# Patient Record
Sex: Female | Born: 1940 | Race: White | Hispanic: Yes | Smoking: Never smoker
Health system: Southern US, Community
[De-identification: ages and names within clinical notes are randomized; demographics above are authoritative.]

## PROBLEM LIST (undated history)

## (undated) DIAGNOSIS — F0150 Vascular dementia without behavioral disturbance: Secondary | ICD-10-CM

## (undated) DIAGNOSIS — F329 Major depressive disorder, single episode, unspecified: Secondary | ICD-10-CM

## (undated) DIAGNOSIS — F32A Depression, unspecified: Secondary | ICD-10-CM

## (undated) DIAGNOSIS — H269 Unspecified cataract: Secondary | ICD-10-CM

## (undated) DIAGNOSIS — R4702 Dysphasia: Secondary | ICD-10-CM

## (undated) DIAGNOSIS — R6251 Failure to thrive (child): Secondary | ICD-10-CM

## (undated) DIAGNOSIS — E119 Type 2 diabetes mellitus without complications: Secondary | ICD-10-CM

## (undated) DIAGNOSIS — I10 Essential (primary) hypertension: Secondary | ICD-10-CM

## (undated) DIAGNOSIS — R630 Anorexia: Secondary | ICD-10-CM

## (undated) DIAGNOSIS — F419 Anxiety disorder, unspecified: Secondary | ICD-10-CM

## (undated) DIAGNOSIS — I2510 Atherosclerotic heart disease of native coronary artery without angina pectoris: Secondary | ICD-10-CM

## (undated) DIAGNOSIS — I639 Cerebral infarction, unspecified: Secondary | ICD-10-CM

## (undated) DIAGNOSIS — E785 Hyperlipidemia, unspecified: Secondary | ICD-10-CM

## (undated) DIAGNOSIS — E876 Hypokalemia: Secondary | ICD-10-CM

## (undated) HISTORY — PX: OTHER SURGICAL HISTORY: SHX169

---

## 2017-08-14 ENCOUNTER — Emergency Department (HOSPITAL_COMMUNITY): Payer: Medicaid Other

## 2017-08-14 ENCOUNTER — Other Ambulatory Visit: Payer: Self-pay

## 2017-08-14 ENCOUNTER — Inpatient Hospital Stay (HOSPITAL_COMMUNITY)
Admission: EM | Admit: 2017-08-14 | Discharge: 2017-08-22 | DRG: 070 | Disposition: A | Discharge status: Hospice | Payer: Medicaid Other | Attending: Internal Medicine | Admitting: Internal Medicine

## 2017-08-14 ENCOUNTER — Encounter (HOSPITAL_COMMUNITY): Payer: Self-pay | Admitting: *Deleted

## 2017-08-14 DIAGNOSIS — Z515 Encounter for palliative care: Secondary | ICD-10-CM | POA: Diagnosis not present

## 2017-08-14 DIAGNOSIS — E43 Unspecified severe protein-calorie malnutrition: Secondary | ICD-10-CM | POA: Diagnosis present

## 2017-08-14 DIAGNOSIS — Z7189 Other specified counseling: Secondary | ICD-10-CM | POA: Diagnosis not present

## 2017-08-14 DIAGNOSIS — E871 Hypo-osmolality and hyponatremia: Secondary | ICD-10-CM | POA: Diagnosis not present

## 2017-08-14 DIAGNOSIS — G9341 Metabolic encephalopathy: Secondary | ICD-10-CM | POA: Diagnosis present

## 2017-08-14 DIAGNOSIS — F0150 Vascular dementia without behavioral disturbance: Secondary | ICD-10-CM | POA: Diagnosis present

## 2017-08-14 DIAGNOSIS — R52 Pain, unspecified: Secondary | ICD-10-CM | POA: Diagnosis not present

## 2017-08-14 DIAGNOSIS — I639 Cerebral infarction, unspecified: Secondary | ICD-10-CM | POA: Diagnosis not present

## 2017-08-14 DIAGNOSIS — I69351 Hemiplegia and hemiparesis following cerebral infarction affecting right dominant side: Secondary | ICD-10-CM | POA: Diagnosis not present

## 2017-08-14 DIAGNOSIS — N179 Acute kidney failure, unspecified: Secondary | ICD-10-CM | POA: Diagnosis present

## 2017-08-14 DIAGNOSIS — E785 Hyperlipidemia, unspecified: Secondary | ICD-10-CM | POA: Diagnosis present

## 2017-08-14 DIAGNOSIS — E876 Hypokalemia: Secondary | ICD-10-CM | POA: Diagnosis not present

## 2017-08-14 DIAGNOSIS — Z66 Do not resuscitate: Secondary | ICD-10-CM | POA: Diagnosis not present

## 2017-08-14 DIAGNOSIS — G3183 Dementia with Lewy bodies: Secondary | ICD-10-CM | POA: Diagnosis present

## 2017-08-14 DIAGNOSIS — Z8049 Family history of malignant neoplasm of other genital organs: Secondary | ICD-10-CM

## 2017-08-14 DIAGNOSIS — E870 Hyperosmolality and hypernatremia: Secondary | ICD-10-CM | POA: Diagnosis present

## 2017-08-14 DIAGNOSIS — I10 Essential (primary) hypertension: Secondary | ICD-10-CM | POA: Diagnosis present

## 2017-08-14 DIAGNOSIS — E861 Hypovolemia: Secondary | ICD-10-CM | POA: Diagnosis present

## 2017-08-14 DIAGNOSIS — E119 Type 2 diabetes mellitus without complications: Secondary | ICD-10-CM | POA: Diagnosis present

## 2017-08-14 DIAGNOSIS — Z833 Family history of diabetes mellitus: Secondary | ICD-10-CM

## 2017-08-14 DIAGNOSIS — I2510 Atherosclerotic heart disease of native coronary artery without angina pectoris: Secondary | ICD-10-CM | POA: Diagnosis present

## 2017-08-14 DIAGNOSIS — E860 Dehydration: Secondary | ICD-10-CM | POA: Diagnosis present

## 2017-08-14 DIAGNOSIS — E875 Hyperkalemia: Secondary | ICD-10-CM | POA: Diagnosis not present

## 2017-08-14 DIAGNOSIS — G8929 Other chronic pain: Secondary | ICD-10-CM | POA: Diagnosis not present

## 2017-08-14 DIAGNOSIS — R627 Adult failure to thrive: Secondary | ICD-10-CM | POA: Diagnosis not present

## 2017-08-14 DIAGNOSIS — E872 Acidosis: Secondary | ICD-10-CM | POA: Diagnosis not present

## 2017-08-14 DIAGNOSIS — F0280 Dementia in other diseases classified elsewhere without behavioral disturbance: Secondary | ICD-10-CM | POA: Diagnosis present

## 2017-08-14 DIAGNOSIS — L8990 Pressure ulcer of unspecified site, unspecified stage: Secondary | ICD-10-CM | POA: Diagnosis present

## 2017-08-14 DIAGNOSIS — R4702 Dysphasia: Secondary | ICD-10-CM | POA: Diagnosis present

## 2017-08-14 DIAGNOSIS — N390 Urinary tract infection, site not specified: Secondary | ICD-10-CM | POA: Diagnosis present

## 2017-08-14 DIAGNOSIS — N3000 Acute cystitis without hematuria: Secondary | ICD-10-CM | POA: Diagnosis not present

## 2017-08-14 HISTORY — DX: Vascular dementia, unspecified severity, without behavioral disturbance, psychotic disturbance, mood disturbance, and anxiety: F01.50

## 2017-08-14 HISTORY — DX: Failure to thrive (child): R62.51

## 2017-08-14 HISTORY — DX: Cerebral infarction, unspecified: I63.9

## 2017-08-14 HISTORY — DX: Anxiety disorder, unspecified: F41.9

## 2017-08-14 HISTORY — DX: Anorexia: R63.0

## 2017-08-14 HISTORY — DX: Atherosclerotic heart disease of native coronary artery without angina pectoris: I25.10

## 2017-08-14 HISTORY — DX: Depression, unspecified: F32.A

## 2017-08-14 HISTORY — DX: Hypokalemia: E87.6

## 2017-08-14 HISTORY — DX: Major depressive disorder, single episode, unspecified: F32.9

## 2017-08-14 HISTORY — DX: Hyperlipidemia, unspecified: E78.5

## 2017-08-14 HISTORY — DX: Dysphasia: R47.02

## 2017-08-14 HISTORY — DX: Essential (primary) hypertension: I10

## 2017-08-14 HISTORY — DX: Unspecified cataract: H26.9

## 2017-08-14 HISTORY — DX: Type 2 diabetes mellitus without complications: E11.9

## 2017-08-14 LAB — CBC WITH DIFFERENTIAL/PLATELET
Basophils Absolute: 0 10*3/uL (ref 0.0–0.1)
Basophils Relative: 0 %
EOS ABS: 0 10*3/uL (ref 0.0–0.7)
EOS PCT: 0 %
HCT: 50.9 % — ABNORMAL HIGH (ref 36.0–46.0)
Hemoglobin: 15.5 g/dL — ABNORMAL HIGH (ref 12.0–15.0)
LYMPHS ABS: 2.7 10*3/uL (ref 0.7–4.0)
LYMPHS PCT: 26 %
MCH: 30.1 pg (ref 26.0–34.0)
MCHC: 30.5 g/dL (ref 30.0–36.0)
MCV: 98.8 fL (ref 78.0–100.0)
MONO ABS: 0.6 10*3/uL (ref 0.1–1.0)
Monocytes Relative: 5 %
Neutro Abs: 7.1 10*3/uL (ref 1.7–7.7)
Neutrophils Relative %: 69 %
PLATELETS: 209 10*3/uL (ref 150–400)
RBC: 5.15 MIL/uL — ABNORMAL HIGH (ref 3.87–5.11)
RDW: 16.4 % — AB (ref 11.5–15.5)
WBC: 10.4 10*3/uL (ref 4.0–10.5)

## 2017-08-14 LAB — BASIC METABOLIC PANEL
BUN: 132 mg/dL — AB (ref 6–20)
CALCIUM: 8.5 mg/dL — AB (ref 8.9–10.3)
CO2: 20 mmol/L — ABNORMAL LOW (ref 22–32)
CREATININE: 2.27 mg/dL — AB (ref 0.44–1.00)
Chloride: >130 mmol/L (ref 101–111)
GFR calc non Af Amer: 20 mL/min — ABNORMAL LOW (ref >60)
GFR, EST AFRICAN AMERICAN: 23 mL/min — AB (ref >60)
GLUCOSE: 123 mg/dL — AB (ref 65–99)
Potassium: 3.9 mmol/L (ref 3.5–5.1)
Sodium: 171 mmol/L (ref 135–145)

## 2017-08-14 LAB — COMPREHENSIVE METABOLIC PANEL
ALBUMIN: 3.5 g/dL (ref 3.5–5.0)
ALT: 22 U/L (ref 14–54)
AST: 18 U/L (ref 15–41)
Alkaline Phosphatase: 90 U/L (ref 38–126)
BILIRUBIN TOTAL: 0.8 mg/dL (ref 0.3–1.2)
BUN: 140 mg/dL — AB (ref 6–20)
CO2: 20 mmol/L — ABNORMAL LOW (ref 22–32)
CREATININE: 2.63 mg/dL — AB (ref 0.44–1.00)
Calcium: 9.3 mg/dL (ref 8.9–10.3)
GFR calc Af Amer: 19 mL/min — ABNORMAL LOW (ref >60)
GFR calc non Af Amer: 16 mL/min — ABNORMAL LOW (ref >60)
GLUCOSE: 133 mg/dL — AB (ref 65–99)
POTASSIUM: 4.3 mmol/L (ref 3.5–5.1)
Sodium: 172 mmol/L (ref 135–145)
Total Protein: 8 g/dL (ref 6.5–8.1)

## 2017-08-14 LAB — I-STAT CG4 LACTIC ACID, ED
LACTIC ACID, VENOUS: 1.8 mmol/L (ref 0.5–1.9)
Lactic Acid, Venous: 2.12 mmol/L (ref 0.5–1.9)

## 2017-08-14 LAB — URINALYSIS, ROUTINE W REFLEX MICROSCOPIC
Bilirubin Urine: NEGATIVE
Glucose, UA: NEGATIVE mg/dL
Hgb urine dipstick: NEGATIVE
KETONES UR: NEGATIVE mg/dL
Nitrite: NEGATIVE
PH: 5 (ref 5.0–8.0)
Protein, ur: NEGATIVE mg/dL
SPECIFIC GRAVITY, URINE: 1.02 (ref 1.005–1.030)

## 2017-08-14 LAB — I-STAT TROPONIN, ED: Troponin i, poc: 0.03 ng/mL (ref 0.00–0.08)

## 2017-08-14 LAB — CBG MONITORING, ED: GLUCOSE-CAPILLARY: 132 mg/dL — AB (ref 65–99)

## 2017-08-14 LAB — GLUCOSE, CAPILLARY: Glucose-Capillary: 107 mg/dL — ABNORMAL HIGH (ref 65–99)

## 2017-08-14 LAB — SODIUM: SODIUM: 172 mmol/L — AB (ref 135–145)

## 2017-08-14 LAB — OSMOLALITY: Osmolality: 397 mOsm/kg (ref 275–295)

## 2017-08-14 MED ORDER — VANCOMYCIN HCL 500 MG IV SOLR: 500.0000 mg | INTRAVENOUS | Status: DC

## 2017-08-14 MED ORDER — ONDANSETRON HCL 4 MG/2ML IJ SOLN: 4.0000 mg | Freq: Three times a day (TID) | INTRAMUSCULAR | Status: DC | PRN

## 2017-08-14 MED ORDER — SODIUM CHLORIDE 0.9 % IV SOLN
Freq: Once | INTRAVENOUS | Status: AC
  Administered 2017-08-14: 150 mL/h via INTRAVENOUS

## 2017-08-14 MED ORDER — VANCOMYCIN HCL IN DEXTROSE 1-5 GM/200ML-% IV SOLN
1000.0000 mg | Freq: Once | INTRAVENOUS | Status: AC
  Administered 2017-08-14: 1000 mg via INTRAVENOUS
  Filled 2017-08-14: qty 200

## 2017-08-14 MED ORDER — DEXTROSE 5 % IV SOLN
1.0000 g | INTRAVENOUS | Status: DC
  Administered 2017-08-14 – 2017-08-17 (×4): 1 g via INTRAVENOUS
  Filled 2017-08-14 (×4): qty 10

## 2017-08-14 MED ORDER — DEXTROSE 5 % IV SOLN
INTRAVENOUS | Status: DC
  Administered 2017-08-14: 23:00:00 via INTRAVENOUS

## 2017-08-14 MED ORDER — PIPERACILLIN-TAZOBACTAM IN DEX 2-0.25 GM/50ML IV SOLN
2.2500 g | Freq: Four times a day (QID) | INTRAVENOUS | Status: DC
  Filled 2017-08-14 (×2): qty 50

## 2017-08-14 MED ORDER — INSULIN ASPART 100 UNIT/ML ~~LOC~~ SOLN
0.0000 [IU] | Freq: Three times a day (TID) | SUBCUTANEOUS | Status: DC
  Administered 2017-08-15: 1 [IU] via SUBCUTANEOUS

## 2017-08-14 MED ORDER — PIPERACILLIN-TAZOBACTAM 3.375 G IVPB 30 MIN
3.3750 g | Freq: Once | INTRAVENOUS | Status: AC
  Administered 2017-08-14: 3.375 g via INTRAVENOUS
  Filled 2017-08-14: qty 50

## 2017-08-14 MED ORDER — DEXTROSE-NACL 5-0.45 % IV SOLN: INTRAVENOUS | Status: DC

## 2017-08-14 MED ORDER — SODIUM CHLORIDE 0.9 % IV BOLUS (SEPSIS)
1000.0000 mL | Freq: Once | INTRAVENOUS | Status: AC
  Administered 2017-08-14: 1000 mL via INTRAVENOUS

## 2017-08-14 MED ORDER — ASPIRIN 300 MG RE SUPP
150.0000 mg | Freq: Every day | RECTAL | Status: DC
  Administered 2017-08-15 – 2017-08-19 (×6): 150 mg via RECTAL
  Filled 2017-08-14 (×7): qty 1

## 2017-08-14 MED ORDER — INSULIN GLARGINE 100 UNIT/ML ~~LOC~~ SOLN
7.0000 [IU] | Freq: Two times a day (BID) | SUBCUTANEOUS | Status: DC
  Administered 2017-08-14 – 2017-08-15 (×2): 7 [IU] via SUBCUTANEOUS
  Filled 2017-08-14 (×4): qty 0.07

## 2017-08-14 MED ORDER — ACETAMINOPHEN 650 MG RE SUPP
650.0000 mg | Freq: Four times a day (QID) | RECTAL | Status: DC | PRN
  Filled 2017-08-14: qty 1

## 2017-08-14 MED ORDER — HYDRALAZINE HCL 20 MG/ML IJ SOLN
5.0000 mg | INTRAMUSCULAR | Status: DC | PRN
Start: 2017-08-14 — End: 2017-08-19

## 2017-08-14 MED ORDER — ENOXAPARIN SODIUM 30 MG/0.3ML ~~LOC~~ SOLN
30.0000 mg | SUBCUTANEOUS | Status: DC
  Administered 2017-08-14 – 2017-08-18 (×5): 30 mg via SUBCUTANEOUS
  Filled 2017-08-14 (×5): qty 0.3

## 2017-08-14 NOTE — ED Triage Notes (Addendum)
[facility].  Per norm pt is non-verbal and can only track with her eyes.  Pt will not follow commands of EMS.  Hx of stroke with R sided paralysis.  Presently L sided facial droop.  cbg 126, 118/86, hr 120, rr 24, 93 RA (98% on 3L).  18 LAC.  LSN "some time in the middle of the night".

## 2017-08-14 NOTE — ED Notes (Signed)
MD notified of elevated labs. 

## 2017-08-14 NOTE — H&P (Addendum)
History and Physical    Pamela Parks WUJ:[id number] DOB: 10-19-1940 DOA: 08/14/2017  Referring MD/NP/PA:   PCP: Justin Mend, MD   Patient coming from:  The patient is coming from SNF.  At baseline, pt is dependent for most of ADL.  Chief Complaint: AMS  HPI: Pamela Parks is a 77 y.o. female with medical history significant of DM, CAD, HLD, HTN, prior CVA with right sided weakness, vascular dementia, non-verbal at baseline, who presents with AMS.  Patient has dementia, non-verbal at baseline and AMS. She is unable to provide any medical history. I spoke to her daughter on the phone. She told me that she dose not know exactly what happened to her. She states that she was called from SNF and was told that her mother is more confused. Most of the history is obtained by discussing the case with ED physician, per EMS report, and with the nursing staff.  Per report, the patient is a resident at [facility] and [address]  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 According to EMS they were told she is nonverbal but can track people around the room with her eyes at baseline. She was noted to be more confused than her baseline. She was last seen at her normal baseline at some point in the night. When I saw pt in ED, she is nonverbal, does not follow commands, not oriented 3. No active respiratory distress, cough, nausea, vomiting, diarrhea noted. She moves her left arm and slightly moves left leg. She has right-sided paralysis. Not sure if she has any pain any where.  ED Course: pt was found to have WBC 10.4, positive urinalysis with moderate amount of leukocyte, lactic acid of 2.12, 1.86, sodium 172, creatinine 2.63, BUN 140, tachycardia,tachypnea, oxygen saturation 94% on room air, chest x-ray negative, troponin negative. Patient is admitted to telemetry bed as inpatient.  CT-head showed: 1. Chronic left MCA and watershed distribution infarcts. No acute  intracranial abnormality. Atrophy with chronic small vessel ischemia. 2. Unusual hyperdensities, slightly serpiginous appearance within the left globe as above. Recommend correlation with patient's prior ophthalmologic procedures and exam. Given maintained spherical appearance of the globe, pthisis bulbi is believed less likely. Varix or stigmata of prior ocular intervention are some considerations.  Review of Systems: Could not be reviewed due to dementia, nonverbal status and altered mental status  Allergy: No Known Allergies  Past Medical History:  Diagnosis Date  . Anorexia   . Anxiety   . Cataract   . Coronary artery disease   . Depression   . Diabetes mellitus without complication (HCC)   . Dysphasia   . Failure to thrive (0-17)   . Hyperlipidemia   . Hypertension   . Hypokalemia   . Stroke [facility])    R sided paralysis  . Vascular dementia     Past Surgical History:  Procedure Laterality Date  . left arm     bone fracture    Social History:  reports that  has never smoked. She does not have any smokeless tobacco history on file. She reports that she does not drink alcohol or use drugs.   Family History:  Family History  Problem Relation Age of Onset  . Uterine cancer Mother   . Diabetes Mellitus II Father     Prior to Admission medications   Not on File    Physical Exam: Vitals:   08/14/17 1700 08/14/17 1715 08/14/17 1730 08/14/17 1930  BP: 121/78 122/75 113/77 108/72  Pulse: 99 97 (!) 101 88  Resp: 15 13 13 13   Temp:      TempSrc:      SpO2: 96% 95% 96% 95%   General: Not in acute distress. Dry mucus and membrane, cachectic looking HEENT:       Eyes: right eye with PERRL, EOMI, no scleral icterus. Has cataract in left eye       ENT: No discharge from the ears and nose.       Neck: No JVD, no bruit, no mass felt. Heme: No neck lymph node enlargement. Cardiac: S1/S2, RRR, No murmurs, No gallops or rubs. Respiratory: No rales, wheezing, rhonchi or  rubs. GI: Soft, nondistended, nontender, no organomegaly, BS present. GU: No hematuria Ext: No pitting leg edema bilaterally. 2+DP/PT pulse bilaterally. Musculoskeletal: No joint deformities. Skin: No rashes.  Neuro: AMS, not following commands, not oriented X3, She moves her left arm and slightly moves left leg. She has right-sided paralysis. Psych: Patient is not psychotic.  Labs on Admission: I have personally reviewed following labs and imaging studies  CBC: Recent Labs  Lab 08/14/17 1603  WBC 10.4  NEUTROABS 7.1  HGB 15.5*  HCT 50.9*  MCV 98.8  PLT 209   Basic Metabolic Panel: Recent Labs  Lab 08/14/17 1603 08/14/17 1809  NA 172* 172*  K 4.3  --   CL >130*  --   CO2 20*  --   GLUCOSE 133*  --   BUN 140*  --   CREATININE 2.63*  --   CALCIUM 9.3  --    GFR: CrCl cannot be calculated (Unknown ideal weight.). Liver Function Tests: Recent Labs  Lab 08/14/17 1603  AST 18  ALT 22  ALKPHOS 90  BILITOT 0.8  PROT 8.0  ALBUMIN 3.5   No results for input(s): LIPASE, AMYLASE in the last 168 hours. No results for input(s): AMMONIA in the last 168 hours. Coagulation Profile: No results for input(s): INR, PROTIME in the last 168 hours. Cardiac Enzymes: No results for input(s): CKTOTAL, CKMB, CKMBINDEX, TROPONINI in the last 168 hours. BNP (last 3 results) No results for input(s): PROBNP in the last 8760 hours. HbA1C: No results for input(s): HGBA1C in the last 72 hours. CBG: Recent Labs  Lab 08/14/17 1542  GLUCAP 132*   Lipid Profile: No results for input(s): CHOL, HDL, LDLCALC, TRIG, CHOLHDL, LDLDIRECT in the last 72 hours. Thyroid Function Tests: No results for input(s): TSH, T4TOTAL, FREET4, T3FREE, THYROIDAB in the last 72 hours. Anemia Panel: No results for input(s): VITAMINB12, FOLATE, FERRITIN, TIBC, IRON, RETICCTPCT in the last 72 hours. Urine analysis:    Component Value Date/Time   COLORURINE AMBER (A) 08/14/2017 1727   APPEARANCEUR CLOUDY (A)  08/14/2017 1727   LABSPEC 1.020 08/14/2017 1727   PHURINE 5.0 08/14/2017 1727   GLUCOSEU NEGATIVE 08/14/2017 1727   HGBUR NEGATIVE 08/14/2017 1727   BILIRUBINUR NEGATIVE 08/14/2017 1727   KETONESUR NEGATIVE 08/14/2017 1727   PROTEINUR NEGATIVE 08/14/2017 1727   NITRITE NEGATIVE 08/14/2017 1727   LEUKOCYTESUR MODERATE (A) 08/14/2017 1727   Sepsis Labs: @LABRCNTIP (procalcitonin:4,lacticidven:4) )No results found for this or any previous visit (from the past 240 hour(s)).   Radiological Exams on Admission: Dg Chest 2 View  Result Date: 08/14/2017 CLINICAL DATA:  Tachycardia and altered mental status. EXAM: CHEST  2 VIEW COMPARISON:  None. FINDINGS: Heart size is top-normal. Moderate aortic atherosclerosis at the arch without aneurysm. Lungs are clear with minimal scarring or atelectasis in the left lower lobe. Left shoulder arthroplasty  without complications. Mild degenerate change along the dorsal spine. IMPRESSION: No active cardiopulmonary disease.  Aortic atherosclerosis. Electronically Signed   By: Tollie Ethavid  Kwon M.D.   On: 08/14/2017 17:14   Ct Head Wo Contrast  Result Date: 08/14/2017 CLINICAL DATA:  Altered level of consciousness unexplained. History of right-sided paralysis with left-sided facial droop. Patient is nonverbal. EXAM: CT HEAD WITHOUT CONTRAST TECHNIQUE: Contiguous axial images were obtained from the base of the skull through the vertex without intravenous contrast. COMPARISON:  None. FINDINGS: Brain: Frontoparietal left-sided encephalomalacia with volume loss compatible with remote infarcts. Superficial and central atrophy is noted with chronic appearing small vessel ischemic disease. No acute intracranial hemorrhage, midline shift or edema. No intra-axial mass nor extra-axial fluid collections. Vascular: Moderate atherosclerotic calcifications of the cavernous sinus carotids. No hyperdense vessels. Skull: Negative for fracture or focal lesions. Sinuses/Orbits: Mild sphenoid  sinus mucosal thickening. Intact globes with unusual serpiginous hyperdensities in the left globe possibly representing a varix. Recommend correlation with patient's prior ophthalmologic procedures and exam. Other: None IMPRESSION: 1. Chronic left MCA and watershed distribution infarcts. No acute intracranial abnormality. Atrophy with chronic small vessel ischemia. 2. Unusual hyperdensities, slightly serpiginous appearance within the left globe as above. Recommend correlation with patient's prior ophthalmologic procedures and exam. Given maintained spherical appearance of the globe, pthisis bulbi is believed less likely. Varix or stigmata of prior ocular intervention are some considerations. Electronically Signed   By: Tollie Ethavid  Kwon M.D.   On: 08/14/2017 17:03     EKG: Independently reviewed.  Sinus rhythm, QTC 498, LAD, low voltage, poor R-wave progression   Assessment/Plan Principal Problem:   Acute metabolic encephalopathy Active Problems:   Stroke (HCC)   Hypertension   Diabetes mellitus without complication (HCC)   HLD (hyperlipidemia)   Dehydration   Hypernatremia   AKI (acute kidney injury) (HCC)   UTI (urinary tract infection)   Protein-calorie malnutrition, severe (HCC)   Acute metabolic encephalopathy: Likely due to metastatic to factor etiology, including UTI, hyponatremia, dehydration, worsening renal function, in the setting of dementia. CT head did not show acute new intracranial abnormalities.  -will admit to tele bed as inpt -treat UTI, hydration and correct hyperatremia -Frequent neuro checks -Hold oral medications  UTI: -IV rocephin -f/u Bx and Ux  Hypernatremia and dehydration: Na 172.  This is likely chronic (>48 hours).  -IVF: received 1L NS bolus in ED, will continue D5 at 100 cc/h -check BMP q4h -check urine and plasma Osmo and urine sodium level  Addendum: the BMP showed Na decreased from 172 to 166 in about 8 hours, too fast. -will d/c D5  -start NS at  rate of 75 cc/h -f/u BMP q4h  Hx of Stroke [facility](HCC): -ASA per rectum   Protein-calorie malnutrition, severe (HCC) -Consult to Nutrition  HTN: -IV prn hydralazine  Diabetes mellitus without complication (HCC): Last A1c not on record. Patient is taking lantus at home -will decrease Lantus dose from 14 to 7 U bid -SSI -Check A1c  AKI: Cre 2.63 and BUN 140. No baseline Cre available. ikely due to prerenal secondary to dehydration - IVF as above - Check FeNa  - Follow up renal function by BMP  HLD (hyperlipidemia): not taking meds. -check FLP  Abnormal findings by CT-head: CT-head showed "unusual hyperdensities, slightly serpiginous appearance within the left globe as above. Recommend correlation with patient's prior ophthalmologic procedures and exam. Given maintained spherical appearance of the globe, pthisis bulbi is believed less likely. Varix or stigmata of prior ocular intervention are  some considerations". -May need to have further clarification with family in the morning   DVT ppx: SQ Lovenox Code Status: Full code ( I discussed with her daughter on the phone) Family Communication: None at bed side.   Disposition Plan:  Anticipate discharge back to previous SNF Consults called:  none Admission status:  Inpatient/tele      Date of Service 08/14/2017    Lorretta Harp [facility] Pager [phone]  If 7PM-7AM, please contact night-coverage [email or web address] Password [email or web address] 08/14/2017, 8:20 PM

## 2017-08-14 NOTE — ED Notes (Signed)
PT to ct

## 2017-08-14 NOTE — Progress Notes (Signed)
Pharmacy Antibiotic Note  Pamela MartiniMaria Parks is a 77 y.o. female admitted on 08/14/2017 with sepsis.  Pharmacy has been consulted for vancomycin and Zosyn dosing. Elevated Scr at 2.63 (Normalized CrCl 20.4 ml/min). WBC and lactate WNL. Ke =0.021 Vd=36L T1/2 ~ 33 hours  Plan: Vancomycin 1000mg  IV x1 Vancomycin 500mg  IV q48h. Goal Trough 15-20 mcg/mL.  Zosyn 3.375gm IV x1 (30 min infusion) Zosyn 2.25gm IV q6h F/u renal fxn, trough @ SS, clinical resolution  No data recorded.  Recent Labs  Lab 08/14/17 1603 08/14/17 1608 08/14/17 1829  WBC 10.4  --   --   CREATININE 2.63*  --   --   LATICACIDVEN  --  2.12* 1.80    CrCl cannot be calculated (Unknown ideal weight.).    Allergies no known allergies  Antimicrobials this admission: Vanc 1/23 >>  Zosyn 1/23 >>   Dose adjustments this admission: None  Microbiology results: Pending  Thank you for allowing pharmacy to be a part of this patient's care.  Robie Mcniel 08/14/2017 6:35 PM

## 2017-08-14 NOTE — ED Provider Notes (Signed)
Emergency Department Provider Note   I have reviewed the triage vital signs and the nursing notes.   HISTORY  HPI Pamela Parks is a 77 y.o. female with PMH of DM, CAD, HLD, HTN, prior CVA with right sided weakness, and vascular dementia presents to the emergency department by EMS with concern for altered mental status.  [facility] [address] [facility]  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  EMS state that patient is more altered compared to her baseline.  According to EMS they were told she is nonverbal but can track people around the room with her eyes.  Staff report that she was last seen normal at some point in the night.  No other supporting information from EMS.   Level 5 caveat: Dementia and acute AMS.    Past Medical History:  Diagnosis Date  . Anorexia   . Anxiety   . Cataract   . Coronary artery disease   . Depression   . Diabetes mellitus without complication (HCC)   . Dysphasia   . Failure to thrive (0-17)   . Hyperlipidemia   . Hypertension   . Hypokalemia   . Stroke [facility](HCC)    R sided paralysis  . Vascular dementia     Patient Active Problem List   Diagnosis Date Noted  . HLD (hyperlipidemia) 08/14/2017  . Acute metabolic encephalopathy 08/14/2017  . Dehydration 08/14/2017  . Hypernatremia 08/14/2017  . AKI (acute kidney injury) (HCC) 08/14/2017  . UTI (urinary tract infection) 08/14/2017  . Protein-calorie malnutrition, severe (HCC) 08/14/2017  . Stroke (HCC)   . Hypertension   . Diabetes mellitus without complication [place](HCC)     Past Surgical History:  Procedure Laterality Date  . left arm     bone fracture      Allergies Patient has no known allergies.  Family History  Problem Relation Age of Onset  . Uterine cancer Mother   . Diabetes Mellitus II Father     Social History Social History   Tobacco Use  . Smoking status: Never Smoker  Substance Use Topics  . Alcohol use: No     Frequency: Never  . Drug use: No    Review of Systems  Level 5 caveat: AMS and Dementia.   ____________________________________________   PHYSICAL EXAM:  VITAL SIGNS: Vitals:   08/14/17 2030 08/14/17 2126  BP: 108/72 129/66  Pulse: 85 86  Resp: 13 16  Temp:  97.6 F (36.4 C)  SpO2: 95% 97%    Constitutional: Non-verbal but looking around the room. Not responding to painful stimulus.  Eyes: Conjunctivae are normal. Cloudy cornea on the left. Sluggish pupil on the right.  Head: Atraumatic. Nose: No congestion/rhinnorhea. Mouth/Throat: Mucous membranes are dry. Neck: No stridor.  Cardiovascular: Sinus tachycardia. Good peripheral circulation. Grossly normal heart sounds.   Respiratory: Normal respiratory effort.  No retractions. Lungs CTAB. Gastrointestinal: Soft and nontender. No distention.  Musculoskeletal: Contracted RUE and RLE.  Neurologic: Possible left face droop. Contracted, spastic paralysis of the RUE and RLE.  Skin:  Skin is warm, dry and intact. No rash noted.  ____________________________________________   LABS (all labs ordered are listed, but only abnormal results are displayed)  Labs Reviewed  COMPREHENSIVE METABOLIC PANEL - Abnormal; Notable for the following components:      Result Value   Sodium 172 (*)    Chloride >130 (*)    CO2 20 (*)    Glucose, Bld 133 (*)  BUN 140 (*)    Creatinine, Ser 2.63 (*)    GFR calc non Af Amer 16 (*)    GFR calc Af Amer 19 (*)    All other components within normal limits  CBC WITH DIFFERENTIAL/PLATELET - Abnormal; Notable for the following components:   RBC 5.15 (*)    Hemoglobin 15.5 (*)    HCT 50.9 (*)    RDW 16.4 (*)    All other components within normal limits  URINALYSIS, ROUTINE W REFLEX MICROSCOPIC - Abnormal; Notable for the following components:   Color, Urine AMBER (*)    APPearance CLOUDY (*)    Leukocytes, UA MODERATE (*)    Bacteria, UA MANY (*)    Squamous Epithelial / LPF 0-5 (*)     All other components within normal limits  SODIUM - Abnormal; Notable for the following components:   Sodium 172 (*)    All other components within normal limits  BASIC METABOLIC PANEL - Abnormal; Notable for the following components:   Sodium 171 (*)    Chloride >130 (*)    CO2 20 (*)    Glucose, Bld 123 (*)    BUN 132 (*)    Creatinine, Ser 2.27 (*)    Calcium 8.5 (*)    GFR calc non Af Amer 20 (*)    GFR calc Af Amer 23 (*)    All other components within normal limits  OSMOLALITY - Abnormal; Notable for the following components:   Osmolality 397 (*)    All other components within normal limits  GLUCOSE, CAPILLARY - Abnormal; Notable for the following components:   Glucose-Capillary 107 (*)    All other components within normal limits  CBG MONITORING, ED - Abnormal; Notable for the following components:   Glucose-Capillary 132 (*)    All other components within normal limits  I-STAT CG4 LACTIC ACID, ED - Abnormal; Notable for the following components:   Lactic Acid, Venous 2.12 (*)    All other components within normal limits  CULTURE, BLOOD (ROUTINE X 2)  CULTURE, BLOOD (ROUTINE X 2)  URINE CULTURE  BASIC METABOLIC PANEL  BASIC METABOLIC PANEL  OSMOLALITY, URINE  CREATININE, URINE, RANDOM  SODIUM, URINE, RANDOM  HEMOGLOBIN A1C  LIPID PANEL  I-STAT TROPONIN, ED  I-STAT CG4 LACTIC ACID, ED   ____________________________________________  EKG   EKG Interpretation  Date/Time:  Wednesday August 14 2017 15:36:26 EST Ventricular Rate:  121 PR Interval:    QRS Duration: 82 QT Interval:  351 QTC Calculation: 498 R Axis:   -116 Text Interpretation:  Sinus tachycardia LAD, consider LAFB or inferior infarct Consider anterior infarct No STEMI.  Confirmed by Alona Bene [phone]) on 08/14/2017 3:49:14 PM Also confirmed by Alona Bene [phone]), editor Barbette Hair [phone])  on 08/14/2017 4:29:56 PM       ____________________________________________  RADIOLOGY  Dg Chest  2 View  Result Date: 08/14/2017 CLINICAL DATA:  Tachycardia and altered mental status. EXAM: CHEST  2 VIEW COMPARISON:  None. FINDINGS: Heart size is top-normal. Moderate aortic atherosclerosis at the arch without aneurysm. Lungs are clear with minimal scarring or atelectasis in the left lower lobe. Left shoulder arthroplasty without complications. Mild degenerate change along the dorsal spine. IMPRESSION: No active cardiopulmonary disease.  Aortic atherosclerosis. Electronically Signed   By: Tollie Eth M.D.   On: 08/14/2017 17:14   Ct Head Wo Contrast  Result Date: 08/14/2017 CLINICAL DATA:  Altered level of consciousness unexplained. History of right-sided paralysis with left-sided facial droop. Patient  is nonverbal. EXAM: CT HEAD WITHOUT CONTRAST TECHNIQUE: Contiguous axial images were obtained from the base of the skull through the vertex without intravenous contrast. COMPARISON:  None. FINDINGS: Brain: Frontoparietal left-sided encephalomalacia with volume loss compatible with remote infarcts. Superficial and central atrophy is noted with chronic appearing small vessel ischemic disease. No acute intracranial hemorrhage, midline shift or edema. No intra-axial mass nor extra-axial fluid collections. Vascular: Moderate atherosclerotic calcifications of the cavernous sinus carotids. No hyperdense vessels. Skull: Negative for fracture or focal lesions. Sinuses/Orbits: Mild sphenoid sinus mucosal thickening. Intact globes with unusual serpiginous hyperdensities in the left globe possibly representing a varix. Recommend correlation with patient's prior ophthalmologic procedures and exam. Other: None IMPRESSION: 1. Chronic left MCA and watershed distribution infarcts. No acute intracranial abnormality. Atrophy with chronic small vessel ischemia. 2. Unusual hyperdensities, slightly serpiginous appearance within the left globe as above. Recommend correlation with patient's prior ophthalmologic procedures and exam.  Given maintained spherical appearance of the globe, pthisis bulbi is believed less likely. Varix or stigmata of prior ocular intervention are some considerations. Electronically Signed   By: Tollie Eth M.D.   On: 08/14/2017 17:03    ____________________________________________   PROCEDURES  Procedure(s) performed:   .Critical Care Performed by: Maia Plan, MD Authorized by: Maia Plan, MD   Critical care provider statement:    Critical care time (minutes):  50   Critical care time was exclusive of:  Separately billable procedures and treating other patients and teaching time   Critical care was necessary to treat or prevent imminent or life-threatening deterioration of the following conditions:  Metabolic crisis   Critical care was time spent personally by me on the following activities:  Blood draw for specimens, development of treatment plan with patient or surrogate, discussions with consultants, evaluation of patient's response to treatment, examination of patient, ordering and performing treatments and interventions, ordering and review of laboratory studies, ordering and review of radiographic studies, pulse oximetry, re-evaluation of patient's condition and review of old charts   I assumed direction of critical care for this patient from another provider in my specialty: no    ____________________________________________   INITIAL IMPRESSION / ASSESSMENT AND PLAN / ED COURSE  Pertinent labs & imaging results that were available during my care of the patient were reviewed by me and considered in my medical decision making (see chart for details).  Patient presents to the emergency department for evaluation of altered mental status last seen normal last night.  Staff questions some left face droop which may be new.  Patient has known right-sided deficits from prior stroke.  She is tachycardic here but afebrile.  Lactic acid elevated to 2.12.  Initiated broad-spectrum  antibiotics in case this is from infectious etiology.  Obtaining CT of the head along with chest x-ray and full sepsis workup at this time.   05:37 PM Patient with sodium of 172. Patient appears very dry and hypovolemic. Got 1L bolus and starting on IVF at rate of 150 ml/hr. Ordered Q2H sodium. Waiting on UA and will discuss with admit team. No famiyl at bedside for additional history.   Discussed patient's case with Hospitalist, Dr. Clyde Lundborg to request admission. Patient and family (if present) updated with plan. Care transferred to Hospitalist service.  I reviewed all nursing notes, vitals, pertinent old records, EKGs, labs, imaging (as available).  ____________________________________________  FINAL CLINICAL IMPRESSION(S) / ED DIAGNOSES  Final diagnoses:  Cerebrovascular accident (CVA), unspecified mechanism (HCC)  Essential hypertension  Diabetes mellitus without  complication (HCC)     MEDICATIONS GIVEN DURING THIS VISIT:  Medications  cefTRIAXone (ROCEPHIN) 1 g in dextrose 5 % 50 mL IVPB (1 g Intravenous New Bag/Given 08/14/17 2255)  hydrALAZINE (APRESOLINE) injection 5 mg (not administered)  ondansetron (ZOFRAN) injection 4 mg (not administered)  acetaminophen (TYLENOL) suppository 650 mg (not administered)  aspirin suppository 150 mg (not administered)  insulin glargine (LANTUS) injection 7 Units (not administered)  insulin aspart (novoLOG) injection 0-9 Units (not administered)  enoxaparin (LOVENOX) injection 30 mg (30 mg Subcutaneous Given 08/14/17 2257)  dextrose 5 % solution ( Intravenous New Bag/Given 08/14/17 2253)  sodium chloride 0.9 % bolus 1,000 mL (0 mLs Intravenous Stopped 08/14/17 1737)  piperacillin-tazobactam (ZOSYN) IVPB 3.375 g (0 g Intravenous Stopped 08/14/17 1706)  vancomycin (VANCOCIN) IVPB 1000 mg/200 mL premix (0 mg Intravenous Stopped 08/14/17 1837)  0.9 %  sodium chloride infusion (150 mL/hr Intravenous New Bag/Given 08/14/17 1738)    Note:  This document was  prepared using Dragon voice recognition software and may include unintentional dictation errors.  Alona Bene, MD Emergency Medicine    Phil Corti, Arlyss Repress, MD 08/14/17 [phone]

## 2017-08-15 ENCOUNTER — Encounter (HOSPITAL_COMMUNITY): Payer: Self-pay | Admitting: *Deleted

## 2017-08-15 ENCOUNTER — Other Ambulatory Visit: Payer: Self-pay

## 2017-08-15 ENCOUNTER — Inpatient Hospital Stay (HOSPITAL_COMMUNITY): Payer: Medicaid Other

## 2017-08-15 DIAGNOSIS — E43 Unspecified severe protein-calorie malnutrition: Secondary | ICD-10-CM

## 2017-08-15 LAB — LIPID PANEL
CHOL/HDL RATIO: 6.4 ratio
CHOLESTEROL: 147 mg/dL (ref 0–200)
HDL: 23 mg/dL — AB (ref >40)
LDL Cholesterol: 86 mg/dL (ref 0–99)
TRIGLYCERIDES: 192 mg/dL — AB (ref <150)
VLDL: 38 mg/dL (ref 0–40)

## 2017-08-15 LAB — BASIC METABOLIC PANEL
BUN: 125 mg/dL — ABNORMAL HIGH (ref 6–20)
BUN: 103 mg/dL — AB (ref 6–20)
BUN: 106 mg/dL — AB (ref 6–20)
BUN: 87 mg/dL — ABNORMAL HIGH (ref 6–20)
CALCIUM: 7.9 mg/dL — AB (ref 8.9–10.3)
CALCIUM: 7.7 mg/dL — AB (ref 8.9–10.3)
CO2: 18 mmol/L — ABNORMAL LOW (ref 22–32)
CO2: 19 mmol/L — ABNORMAL LOW (ref 22–32)
CO2: 18 mmol/L — AB (ref 22–32)
CO2: 19 mmol/L — ABNORMAL LOW (ref 22–32)
CREATININE: 1.89 mg/dL — AB (ref 0.44–1.00)
Calcium: 7.8 mg/dL — ABNORMAL LOW (ref 8.9–10.3)
Calcium: 7.8 mg/dL — ABNORMAL LOW (ref 8.9–10.3)
Chloride: >130 mmol/L (ref 101–111)
Chloride: >130 mmol/L (ref 101–111)
Creatinine, Ser: 2.04 mg/dL — ABNORMAL HIGH (ref 0.44–1.00)
Creatinine, Ser: 1.87 mg/dL — ABNORMAL HIGH (ref 0.44–1.00)
Creatinine, Ser: 1.61 mg/dL — ABNORMAL HIGH (ref 0.44–1.00)
GFR calc Af Amer: 29 mL/min — ABNORMAL LOW (ref >60)
GFR calc Af Amer: 28 mL/min — ABNORMAL LOW (ref >60)
GFR calc non Af Amer: 25 mL/min — ABNORMAL LOW (ref >60)
GFR calc non Af Amer: 25 mL/min — ABNORMAL LOW (ref >60)
GFR calc non Af Amer: 30 mL/min — ABNORMAL LOW (ref >60)
GFR, EST AFRICAN AMERICAN: 26 mL/min — AB (ref >60)
GFR, EST AFRICAN AMERICAN: 34 mL/min — AB (ref >60)
GFR, EST NON AFRICAN AMERICAN: 22 mL/min — AB (ref >60)
GLUCOSE: 140 mg/dL — AB (ref 65–99)
Glucose, Bld: 120 mg/dL — ABNORMAL HIGH (ref 65–99)
Glucose, Bld: 61 mg/dL — ABNORMAL LOW (ref 65–99)
Glucose, Bld: 182 mg/dL — ABNORMAL HIGH (ref 65–99)
POTASSIUM: 4 mmol/L (ref 3.5–5.1)
POTASSIUM: 3 mmol/L — AB (ref 3.5–5.1)
Potassium: 3.5 mmol/L (ref 3.5–5.1)
Potassium: 3.4 mmol/L — ABNORMAL LOW (ref 3.5–5.1)
SODIUM: 164 mmol/L — AB (ref 135–145)
Sodium: 166 mmol/L (ref 135–145)
Sodium: 163 mmol/L (ref 135–145)
Sodium: 163 mmol/L (ref 135–145)

## 2017-08-15 LAB — GLUCOSE, CAPILLARY
GLUCOSE-CAPILLARY: 127 mg/dL — AB (ref 65–99)
GLUCOSE-CAPILLARY: 40 mg/dL — AB (ref 65–99)
GLUCOSE-CAPILLARY: 194 mg/dL — AB (ref 65–99)
GLUCOSE-CAPILLARY: 134 mg/dL — AB (ref 65–99)
Glucose-Capillary: 115 mg/dL — ABNORMAL HIGH (ref 65–99)
Glucose-Capillary: 174 mg/dL — ABNORMAL HIGH (ref 65–99)
Glucose-Capillary: 61 mg/dL — ABNORMAL LOW (ref 65–99)

## 2017-08-15 LAB — HEMOGLOBIN A1C
HEMOGLOBIN A1C: 8.4 % — AB (ref 4.8–5.6)
Mean Plasma Glucose: 194.38 mg/dL

## 2017-08-15 LAB — SODIUM, URINE, RANDOM: SODIUM UR: 24 mmol/L

## 2017-08-15 LAB — MRSA PCR SCREENING: MRSA by PCR: NEGATIVE

## 2017-08-15 LAB — OSMOLALITY, URINE: Osmolality, Ur: 538 mOsm/kg (ref 300–900)

## 2017-08-15 LAB — CREATININE, URINE, RANDOM: CREATININE, URINE: 200.83 mg/dL

## 2017-08-15 MED ORDER — DEXTROSE 50 % IV SOLN
INTRAVENOUS | Status: AC
  Administered 2017-08-15: 50 mL
  Filled 2017-08-15: qty 50

## 2017-08-15 MED ORDER — DEXTROSE-NACL 5-0.45 % IV SOLN
INTRAVENOUS | Status: DC
  Administered 2017-08-15 – 2017-08-16 (×2): via INTRAVENOUS

## 2017-08-15 MED ORDER — SODIUM CHLORIDE 0.9 % IV SOLN
INTRAVENOUS | Status: DC
  Administered 2017-08-15: 07:00:00 via INTRAVENOUS

## 2017-08-15 MED ORDER — POTASSIUM CHLORIDE 10 MEQ/100ML IV SOLN
10.0000 meq | INTRAVENOUS | Status: AC
  Administered 2017-08-15 (×4): 10 meq via INTRAVENOUS
  Filled 2017-08-15 (×4): qty 100

## 2017-08-15 MED ORDER — INSULIN ASPART 100 UNIT/ML ~~LOC~~ SOLN
0.0000 [IU] | SUBCUTANEOUS | Status: DC
  Administered 2017-08-16: 2 [IU] via SUBCUTANEOUS
  Administered 2017-08-16: 1 [IU] via SUBCUTANEOUS
  Administered 2017-08-17 (×2): 3 [IU] via SUBCUTANEOUS
  Administered 2017-08-17: 2 [IU] via SUBCUTANEOUS
  Administered 2017-08-17: 3 [IU] via SUBCUTANEOUS
  Administered 2017-08-18: 5 [IU] via SUBCUTANEOUS
  Administered 2017-08-18: 1 [IU] via SUBCUTANEOUS
  Administered 2017-08-18: 3 [IU] via SUBCUTANEOUS
  Administered 2017-08-19: 2 [IU] via SUBCUTANEOUS
  Administered 2017-08-19 (×2): 1 [IU] via SUBCUTANEOUS

## 2017-08-15 NOTE — Progress Notes (Signed)
Patient CBG was 40 at lunch time.  Hypoglycemia protocol followed.  CBG up to 174 after D50 IV push.  MD notified.  Elnita MaxwellSalome N Alyssia Heese, RN

## 2017-08-15 NOTE — Progress Notes (Signed)
Initial Nutrition Assessment  DOCUMENTATION CODES:   Severe malnutrition in context of chronic illness  INTERVENTION:   -RD will follow for diet advancement and supplement as appropriate -If pt desires aggressive care and unable to safely take po's, recommend:  Initiate Jevity 1.2 @ 10 ml/hr and increase by 10 ml every 12 hours to goal rate of 40 ml/hr.   Tube feeding regimen provides 1152 kcal (100% of needs), 53 grams of protein, and 775 ml of H2O.   NUTRITION DIAGNOSIS:   Severe Malnutrition related to chronic illness(dementia) as evidenced by energy intake < 75% for > or equal to 1 month, severe fat depletion, severe muscle depletion.  GOAL:   Patient will meet greater than or equal to 90% of their needs  MONITOR:   Diet advancement, Labs, Weight trends, Skin, I & O's  REASON FOR ASSESSMENT:   Consult, Malnutrition Screening Tool, Low Braden Assessment of nutrition requirement/status  ASSESSMENT:   Pamela Parks is a 77 y.o. female with medical history significant of DM, CAD, HLD, HTN, prior CVA with right sided weakness, vascular dementia, non-verbal at baseline, who presents with AMS.  Pt admitted with acute metabolic encephalopathy.   Reviewed records from [facility]; pertinent medications PTA include Mighty Shake PRN, sliding scale insulin lispro 4 times daily, insulin glargine 7 units BID, remeron, and marinol).   Spoke with pt granddaughter at bedside; pt with multiple family members who are very involved in her care. Granddaughter states pt has experienced a general decline in health over the past 2 years, after being sent to rehab facility in [place]. Pt has continued to decline over the past several months since being transferred to sister facility in [place] as a result of [place]. Per granddaughter, pt has had an even more significant decline over the past month. Pt has been a very poor and selective eater, however, pt has been consuming  bites and sips, despite encouragement and being fed by family members. She was not on a modified diet PTA, byt granddaughter has noticed both decline in lack of desire to eat and difficulty swallowing foods and liquids. Per granddaughter, pt has lost weight, particularly in the past month, however unsure of height, UBW, or amount of wt lost.   Palliative care consulted to discuss goals of care.   Labs reviewed: Na: 163, CBGS: 40-174(inaptient orders for glycemic control are 0-9 units insulin aspart TID with meals and 7 units insulin glargine BID).   NUTRITION - FOCUSED PHYSICAL EXAM:    Most Recent Value  Orbital Region  Mild depletion  Upper Arm Region  Severe depletion  Thoracic and Lumbar Region  Severe depletion  Buccal Region  Mild depletion  Temple Region  Mild depletion  Clavicle Bone Region  Moderate depletion  Clavicle and Acromion Bone Region  Moderate depletion  Scapular Bone Region  Moderate depletion  Dorsal Hand  Severe depletion  Patellar Region  Severe depletion  Anterior Thigh Region  Severe depletion  Posterior Calf Region  Severe depletion  Edema (RD Assessment)  None  Hair  Reviewed  Eyes  Reviewed  Mouth  Reviewed  Skin  Reviewed  Nails  Reviewed       Diet Order:  Diet NPO time specified  EDUCATION NEEDS:   Not appropriate for education at this time  Skin:  Skin Assessment: Reviewed RN Assessment  Last BM:  08/15/17  Height:   Ht Readings from Last 1 Encounters:  No data found for Ht    Weight:  Wt Readings from Last 1 Encounters:  08/15/17 83 lb (37.6 kg)    Ideal Body Weight:     BMI:  There is no height or weight on file to calculate BMI.  Estimated Nutritional Needs:   Kcal:  1100-1300  Protein:  55-70 grams  Fluid:  1.1-1.3 L    Lamontae Ricardo A. Mayford KnifeWilliams, RD, LDN, CDE Pager: [phone] After hours Pager: [phone]

## 2017-08-15 NOTE — Progress Notes (Signed)
PROGRESS NOTE    Pamela MartiniMaria Parks  ZOX:[id number] DOB: [birth year] DOA: 08/14/2017 PCP: Justin MendArnaez Zapata, Gerardo E, MD   Brief Narrative: Pamela MartiniMaria Parks is a 77 y.o. 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 She presented with altered mental status with severe hypernatremia and acute kidney injury.   Assessment & Plan:   Principal Problem:   Acute metabolic encephalopathy Active Problems:   Stroke (HCC)   Hypertension   Diabetes mellitus without complication (HCC)   HLD (hyperlipidemia)   Dehydration   Hypernatremia   AKI (acute kidney injury) (HCC)   UTI (urinary tract infection)   Protein-calorie malnutrition, severe (HCC)   Acute metabolic encephalopathy Patient already has poor baseline. Currently with severe hypernatremia and elevated BUN. Possibly combination of uremia and hypernatremia. -management below  Hypernatremia Patient's sodium of 172. Down to 163. -switch to D5 1/2 NS at 50 ml/hr -Continue BMP q 4 hours until steady decrease achieved  Acute kidney injury Unknown baseline. Improving with IV fluids. BUN improved as well. Minimal urine output. -Bladder scan -Renal ultrasound  UTI Urine culture pending -Continue ceftriaxone  History of stroke -Continue aspirin  Vascular dementia Patient is minimally interactive at baseline. She is not verbal and sometimes follows commands at baseline. Not currently at baseline.  Severe protein-calorie malnutrition -Dietitian consulted  Essential hypertension -Continue IV hydralazine prn  Diabetes mellitus, insulin dependent -Continue Lantus and SSI -Switch to D5 1/2 NS @ 50 ml/hr  Hyperlipidemia Not on medications.  Abnormal CT head findings Unknown if patient has had history of surgery per discussion with family. At this time, no urgent workup. Pending goals of care, can follow-up outpatient.  Goals of  care Patient is appropriate for hospice. Family is under the idea that trying is better than nothing. Discussed patient's apparent course from my assessment. Baseline is already poor. Patient would likely require tube feeding as she is not taking oral nutrition adequately; pros and cons discussed. Will get palliative care to assist with continued goals of care discussion.   DVT prophylaxis: Lovenox Code Status: Full code Family Communication: Daughter and granddaughter at bedside Disposition Plan: Pending medical improvement   Consultants:   Palliative care medicine  Procedures:   None  Antimicrobials:  Vancomycin (1/23)  Zosyn (1/23)  Ceftriaxone (1/23>>   Subjective: Afebrile overnight.  Objective: Vitals:   08/15/17 0428 08/15/17 0638 08/15/17 0758 08/15/17 1140  BP: (!) 96/50  122/68 (!) 113/55  Pulse: 84  83 87  Resp: 16  16 16   Temp: (!) 97.4 F (36.3 C) (!) 97.3 F (36.3 C) (!) 97.3 F (36.3 C) (!) 97.4 F (36.3 C)  TempSrc: Oral Axillary Axillary Axillary  SpO2: 100%  100% 99%  Weight: 37.6 kg (83 lb)       Intake/Output Summary (Last 24 hours) at 08/15/2017 1145 Last data filed at 08/15/2017 0530 Gross per 24 hour  Intake 1423.33 ml  Output 450 ml  Net 973.33 ml   Filed Weights   08/14/17 2126 08/15/17 0428  Weight: 38.1 kg (83 lb 14.4 oz) 37.6 kg (83 lb)    Examination:  General exam: Appears calm and comfortable Respiratory system: Clear to auscultation. Respiratory effort normal. Cardiovascular system: S1 & S2 heard, RRR. No murmurs. Gastrointestinal system: Abdomen is nondistended, soft and nontender. Normal bowel sounds heard. Central nervous system: Somnolent but arouses to noxious stimuli.  Extremities: No edema. No calf tenderness Skin: No cyanosis. No rashes Psychiatry:  Judgement and insight impaired    Data Reviewed: I have personally reviewed following labs and imaging studies  CBC: Recent Labs  Lab 08/14/17 1603  WBC 10.4   NEUTROABS 7.1  HGB 15.5*  HCT 50.9*  MCV 98.8  PLT 209   Basic Metabolic Panel: Recent Labs  Lab 08/14/17 1603 08/14/17 1809 08/14/17 2137 08/15/17 0240 08/15/17 0824  NA 172* 172* 171* 166* 164*  K 4.3  --  3.9 3.5 4.0  CL >130*  --  >130* >130* >130*  CO2 20*  --  20* 18* 19*  GLUCOSE 133*  --  123* 140* 120*  BUN 140*  --  132* 125* 103*  CREATININE 2.63*  --  2.27* 2.04* 1.87*  CALCIUM 9.3  --  8.5* 7.9* 7.8*   GFR: CrCl cannot be calculated (Unknown ideal weight.). Liver Function Tests: Recent Labs  Lab 08/14/17 1603  AST 18  ALT 22  ALKPHOS 90  BILITOT 0.8  PROT 8.0  ALBUMIN 3.5   No results for input(s): LIPASE, AMYLASE in the last 168 hours. No results for input(s): AMMONIA in the last 168 hours. Coagulation Profile: No results for input(s): INR, PROTIME in the last 168 hours. Cardiac Enzymes: No results for input(s): CKTOTAL, CKMB, CKMBINDEX, TROPONINI in the last 168 hours. BNP (last 3 results) No results for input(s): PROBNP in the last 8760 hours. HbA1C: Recent Labs    08/15/17 0240  HGBA1C 8.4*   CBG: Recent Labs  Lab 08/14/17 1542 08/14/17 2133 08/15/17 0801 08/15/17 1135  GLUCAP 132* 107* 127* 40*   Lipid Profile: Recent Labs    08/15/17 0240  CHOL 147  HDL 23*  LDLCALC 86  TRIG 960*  CHOLHDL 6.4   Thyroid Function Tests: No results for input(s): TSH, T4TOTAL, FREET4, T3FREE, THYROIDAB in the last 72 hours. Anemia Panel: No results for input(s): VITAMINB12, FOLATE, FERRITIN, TIBC, IRON, RETICCTPCT in the last 72 hours. Sepsis Labs: Recent Labs  Lab 08/14/17 1608 08/14/17 1829  LATICACIDVEN 2.12* 1.80    Recent Results (from the past 240 hour(s))  Blood Culture (routine x 2)     Status: None (Preliminary result)   Collection Time: 08/14/17  4:25 PM  Result Value Ref Range Status   Specimen Description BLOOD RIGHT HAND  Final   Special Requests   Final    BOTTLES DRAWN AEROBIC AND ANAEROBIC Blood Culture adequate  volume   Culture NO GROWTH < 24 HOURS  Final   Report Status PENDING  Incomplete  Blood Culture (routine x 2)     Status: None (Preliminary result)   Collection Time: 08/14/17  4:58 PM  Result Value Ref Range Status   Specimen Description BLOOD RIGHT FOREARM  Final   Special Requests   Final    BOTTLES DRAWN AEROBIC AND ANAEROBIC Blood Culture adequate volume   Culture NO GROWTH < 24 HOURS  Final   Report Status PENDING  Incomplete  MRSA PCR Screening     Status: None   Collection Time: 08/15/17  3:21 AM  Result Value Ref Range Status   MRSA by PCR NEGATIVE NEGATIVE Final    Comment:        The GeneXpert MRSA Assay (FDA approved for NASAL specimens only), is one component of a comprehensive MRSA colonization surveillance program. It is not intended to diagnose MRSA infection nor to guide or monitor treatment for MRSA infections.          Radiology Studies: Dg Chest 2 View  Result Date: 08/14/2017  CLINICAL DATA:  Tachycardia and altered mental status. EXAM: CHEST  2 VIEW COMPARISON:  None. FINDINGS: Heart size is top-normal. Moderate aortic atherosclerosis at the arch without aneurysm. Lungs are clear with minimal scarring or atelectasis in the left lower lobe. Left shoulder arthroplasty without complications. Mild degenerate change along the dorsal spine. IMPRESSION: No active cardiopulmonary disease.  Aortic atherosclerosis. Electronically Signed   By: Tollie Eth M.D.   On: 08/14/2017 17:14   Ct Head Wo Contrast  Result Date: 08/14/2017 CLINICAL DATA:  Altered level of consciousness unexplained. History of right-sided paralysis with left-sided facial droop. Patient is nonverbal. EXAM: CT HEAD WITHOUT CONTRAST TECHNIQUE: Contiguous axial images were obtained from the base of the skull through the vertex without intravenous contrast. COMPARISON:  None. FINDINGS: Brain: Frontoparietal left-sided encephalomalacia with volume loss compatible with remote infarcts. Superficial and  central atrophy is noted with chronic appearing small vessel ischemic disease. No acute intracranial hemorrhage, midline shift or edema. No intra-axial mass nor extra-axial fluid collections. Vascular: Moderate atherosclerotic calcifications of the cavernous sinus carotids. No hyperdense vessels. Skull: Negative for fracture or focal lesions. Sinuses/Orbits: Mild sphenoid sinus mucosal thickening. Intact globes with unusual serpiginous hyperdensities in the left globe possibly representing a varix. Recommend correlation with patient's prior ophthalmologic procedures and exam. Other: None IMPRESSION: 1. Chronic left MCA and watershed distribution infarcts. No acute intracranial abnormality. Atrophy with chronic small vessel ischemia. 2. Unusual hyperdensities, slightly serpiginous appearance within the left globe as above. Recommend correlation with patient's prior ophthalmologic procedures and exam. Given maintained spherical appearance of the globe, pthisis bulbi is believed less likely. Varix or stigmata of prior ocular intervention are some considerations. Electronically Signed   By: Tollie Eth M.D.   On: 08/14/2017 17:03        Scheduled Meds: . aspirin  150 mg Rectal Daily  . enoxaparin (LOVENOX) injection  30 mg Subcutaneous Q24H  . insulin aspart  0-9 Units Subcutaneous Q4H  . insulin glargine  7 Units Subcutaneous BID   Continuous Infusions: . sodium chloride 75 mL/hr at 08/15/17 0705  . cefTRIAXone (ROCEPHIN)  IV Stopped (08/14/17 2325)     LOS: 1 day     Jacquelin Hawking, MD [facility] 08/15/2017, 11:45 AM Pager: [phone]  If 7PM-7AM, please contact night-coverage [email or web address] Password [facility] 08/15/2017, 11:45 AM

## 2017-08-15 NOTE — Plan of Care (Signed)
  Skin Integrity: Risk for impaired skin integrity will decrease 08/15/2017 0157 - Completed/Met by Evert Kohl, RN   Safety: Ability to remain free from injury will improve 08/15/2017 0157 - Completed/Met by Garnett-Mellinger, Sophronia Simas, RN

## 2017-08-15 NOTE — Progress Notes (Signed)
Pt's family can meet with pallative care 3pm tommorow, they would also like a interpreter present to help with communication, pt and family speaks [place], thanks MIke F Charity fundraiserN.

## 2017-08-15 NOTE — Accreditation Note (Signed)
Patient remains non-verbal throuh  Out the night. Critical lads ( slightly improving) texted  to Dr Clyde LundborgNiu and Wellsite geologistchorr. Patient remains the same only following one with eye movement. No further deviation noted after spoke with Dr Clyde Lundborgniu last night.

## 2017-08-15 NOTE — Significant Event (Addendum)
Rapid Response Event Note  Overview: Call Time 2052 Arrival Time 2100  Initial Focused Assessment: Neurologic Called by RN for patient being "unreponsive", per RN this is not new and patient has been like this since admission.  Charge RN was able to awaken patient with sternal rub and patient does not speak [place] (spanish speaking). When I arrived, patient was awake, nonverbal ( per daughter and grand[facility] and has dementia). Patient was able to follow commands on the left and able to move the left and has right sided weakness and left facial droop from old CVA.   VSS.  Labs indicate hypernatremia (Na of 163) and Cl greater than 130.  RN had call primary service as well. Blood sugar was checked, it was 134. Left eye has had some opthalmic work done but Right eye is normal, reactive, tracks and follows. AKI is improving and sodium and chloride have been elevated, not a new finding.   Of note patient is being treated for a UTI (rocephin) and is getting a renal ultrasound tonight. PMT is meeting with the family tomorrow for GOC.   Interventions: -- Bladder scan patient after she returns from ultrasound, patient has needed I/O caths done -- Follow mental status, CT head from earlier did show chronic left MCA and watershed distribution infarcts and  atrophy with chronic small vessel Ischemia. -- Avoid sedation if possible. -- Currently patient is protecting her airway.   Plan of Care (if not transferred): -- Call RRT if needed  Event Summary:   at   2125   Pamela Parks R

## 2017-08-15 NOTE — Progress Notes (Signed)
Patient had in and out cath done due to low urine output.  430ml clear yellow urine obtained. Patient kept comfortable, no signs of distress. Pamela MaxwellSalome N Dorisann Schwanke, RN

## 2017-08-16 DIAGNOSIS — N3000 Acute cystitis without hematuria: Secondary | ICD-10-CM

## 2017-08-16 DIAGNOSIS — N179 Acute kidney failure, unspecified: Secondary | ICD-10-CM

## 2017-08-16 DIAGNOSIS — Z515 Encounter for palliative care: Secondary | ICD-10-CM

## 2017-08-16 DIAGNOSIS — E870 Hyperosmolality and hypernatremia: Secondary | ICD-10-CM

## 2017-08-16 DIAGNOSIS — E860 Dehydration: Secondary | ICD-10-CM

## 2017-08-16 DIAGNOSIS — G9341 Metabolic encephalopathy: Principal | ICD-10-CM

## 2017-08-16 LAB — BASIC METABOLIC PANEL
Anion gap: 12 (ref 5–15)
BUN: 42 mg/dL — AB (ref 6–20)
BUN: 48 mg/dL — AB (ref 6–20)
BUN: 58 mg/dL — ABNORMAL HIGH (ref 6–20)
BUN: 66 mg/dL — ABNORMAL HIGH (ref 6–20)
BUN: 74 mg/dL — ABNORMAL HIGH (ref 6–20)
BUN: 83 mg/dL — ABNORMAL HIGH (ref 6–20)
CO2: 15 mmol/L — ABNORMAL LOW (ref 22–32)
CO2: 16 mmol/L — ABNORMAL LOW (ref 22–32)
CO2: 17 mmol/L — ABNORMAL LOW (ref 22–32)
CO2: 18 mmol/L — ABNORMAL LOW (ref 22–32)
CO2: 19 mmol/L — ABNORMAL LOW (ref 22–32)
CO2: 20 mmol/L — ABNORMAL LOW (ref 22–32)
CREATININE: 0.94 mg/dL (ref 0.44–1.00)
Calcium: 7.8 mg/dL — ABNORMAL LOW (ref 8.9–10.3)
Calcium: 7.9 mg/dL — ABNORMAL LOW (ref 8.9–10.3)
Calcium: 7.9 mg/dL — ABNORMAL LOW (ref 8.9–10.3)
Calcium: 7.9 mg/dL — ABNORMAL LOW (ref 8.9–10.3)
Calcium: 7.9 mg/dL — ABNORMAL LOW (ref 8.9–10.3)
Calcium: 8 mg/dL — ABNORMAL LOW (ref 8.9–10.3)
Chloride: >130 mmol/L (ref 101–111)
Chloride: >130 mmol/L (ref 101–111)
Chloride: 127 mmol/L — ABNORMAL HIGH (ref 101–111)
Creatinine, Ser: 1.44 mg/dL — ABNORMAL HIGH (ref 0.44–1.00)
Creatinine, Ser: 1.38 mg/dL — ABNORMAL HIGH (ref 0.44–1.00)
Creatinine, Ser: 1.32 mg/dL — ABNORMAL HIGH (ref 0.44–1.00)
Creatinine, Ser: 1.1 mg/dL — ABNORMAL HIGH (ref 0.44–1.00)
Creatinine, Ser: 1.03 mg/dL — ABNORMAL HIGH (ref 0.44–1.00)
GFR calc Af Amer: 39 mL/min — ABNORMAL LOW (ref >60)
GFR calc Af Amer: 42 mL/min — ABNORMAL LOW (ref >60)
GFR calc Af Amer: 44 mL/min — ABNORMAL LOW (ref >60)
GFR calc non Af Amer: 36 mL/min — ABNORMAL LOW (ref >60)
GFR, EST AFRICAN AMERICAN: 55 mL/min — AB (ref >60)
GFR, EST AFRICAN AMERICAN: 59 mL/min — AB (ref >60)
GFR, EST NON AFRICAN AMERICAN: 34 mL/min — AB (ref >60)
GFR, EST NON AFRICAN AMERICAN: 38 mL/min — AB (ref >60)
GFR, EST NON AFRICAN AMERICAN: 47 mL/min — AB (ref >60)
GFR, EST NON AFRICAN AMERICAN: 51 mL/min — AB (ref >60)
GFR, EST NON AFRICAN AMERICAN: 57 mL/min — AB (ref >60)
GLUCOSE: 121 mg/dL — AB (ref 65–99)
Glucose, Bld: 141 mg/dL — ABNORMAL HIGH (ref 65–99)
Glucose, Bld: 143 mg/dL — ABNORMAL HIGH (ref 65–99)
Glucose, Bld: 165 mg/dL — ABNORMAL HIGH (ref 65–99)
Glucose, Bld: 117 mg/dL — ABNORMAL HIGH (ref 65–99)
Glucose, Bld: 124 mg/dL — ABNORMAL HIGH (ref 65–99)
POTASSIUM: 3.1 mmol/L — AB (ref 3.5–5.1)
POTASSIUM: 3.5 mmol/L (ref 3.5–5.1)
POTASSIUM: 3.9 mmol/L (ref 3.5–5.1)
POTASSIUM: 4.1 mmol/L (ref 3.5–5.1)
POTASSIUM: 4.2 mmol/L (ref 3.5–5.1)
Potassium: 3.3 mmol/L — ABNORMAL LOW (ref 3.5–5.1)
SODIUM: 156 mmol/L — AB (ref 135–145)
SODIUM: 159 mmol/L — AB (ref 135–145)
SODIUM: 159 mmol/L — AB (ref 135–145)
SODIUM: 160 mmol/L — AB (ref 135–145)
SODIUM: 162 mmol/L — AB (ref 135–145)
SODIUM: 163 mmol/L — AB (ref 135–145)

## 2017-08-16 LAB — GLUCOSE, CAPILLARY
GLUCOSE-CAPILLARY: 121 mg/dL — AB (ref 65–99)
GLUCOSE-CAPILLARY: 126 mg/dL — AB (ref 65–99)
Glucose-Capillary: 166 mg/dL — ABNORMAL HIGH (ref 65–99)
Glucose-Capillary: 119 mg/dL — ABNORMAL HIGH (ref 65–99)
Glucose-Capillary: 121 mg/dL — ABNORMAL HIGH (ref 65–99)

## 2017-08-16 LAB — URINE CULTURE
Culture: >=100000 — AB
Special Requests: NORMAL

## 2017-08-16 LAB — MAGNESIUM: Magnesium: 2.4 mg/dL (ref 1.7–2.4)

## 2017-08-16 LAB — PHOSPHORUS: Phosphorus: 1.9 mg/dL — ABNORMAL LOW (ref 2.5–4.6)

## 2017-08-16 MED ORDER — DEXTROSE 5 % IV SOLN
INTRAVENOUS | Status: DC
  Administered 2017-08-16: 55 mL via INTRAVENOUS
  Administered 2017-08-17 (×3): via INTRAVENOUS

## 2017-08-16 MED ORDER — POTASSIUM CHLORIDE 10 MEQ/100ML IV SOLN
10.0000 meq | INTRAVENOUS | Status: AC
  Administered 2017-08-16 (×4): 10 meq via INTRAVENOUS
  Filled 2017-08-16 (×4): qty 100

## 2017-08-16 MED ORDER — POTASSIUM PHOSPHATES 15 MMOLE/5ML IV SOLN
20.0000 meq | Freq: Once | INTRAVENOUS | Status: AC
  Administered 2017-08-17: 20 meq via INTRAVENOUS
  Filled 2017-08-16: qty 4.55

## 2017-08-16 NOTE — Progress Notes (Signed)
Na 163, Chloride greater than 130, K 3.4, MD notified, will continue to monitor, Thanks, Lavonda JumboMike F RN.

## 2017-08-16 NOTE — Progress Notes (Addendum)
Pt's family would prefer Saturday in am if available for pallative meeting instead of tommorow, thanks Glenna FellowsMike F RN.

## 2017-08-16 NOTE — Consult Note (Signed)
Consultation Note Date: 08/16/2017   Patient Name: Pamela Parks  DOB: [birth year]  MRN: [id number]  Age / Sex: 77 y.o., female  PCP: Earlyne Iba, MD Referring Physician: Mariel Aloe, MD  Reason for Consultation: Establishing goals of care  HPI/Patient Profile: 77 y.o. female with past medical history of vascular dementia, CVA with residual right sided weakness, DM, who is non-verbal at baseline.  She was admitted on 08/14/2017 with altered mental status from [facility].  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  Labs showed a sodium of 172 and she had acute renal failure with a creatinine of 2.6.  U/A was positive for infection.  Patient is being treated with antibiotics and IV fluids and her labs are improving significantly.  Clinical Assessment and Goals of Care:  I have reviewed medical records including EPIC notes, labs and imaging, received report from the care team, assessed the patient and then met at the bedside along with her daughter to discuss diagnosis prognosis, GOC, EOL wishes, disposition and options.  Her daughter speaks only spanish so an interpreter was used via an [place].  Even with the help of the interpreter the patient's daughter had difficulty understanding my questions.   I gathered from her that her mother had been suffering with dementia for at least 4 years.  The patient has 6 children (5 boys and 1 girl).  The children make decisions about their mother's care together.  There is not one solo Media planner.   The daughter asks me to wait for all of her siblings to arrive before discussing her mother's condition.  Four children live in [place] and two live in [place].  She suggested we talk tomorrow at 2:00 pm in her mothers room.  The daughter looks tearful multiple times during our conversation.   When I ask what's wrong she tells me people keep talking to her about resuscitation.  I advised her not to worry about resuscitation right now and that it would be discussed tomorrow in the meeting.  As far as functional and nutritional status - her daughter reports that her family used to take turns cooking and feeding her [place] - but now that she lives in the nursing home she is too far away and they are relying on the nursing staff.  Evidently her mother has stopped eating and drinking.  Per daughter her mother is Catholic and would appreciate a chaplain visit.   Primary Decision Maker:  NEXT OF KIN 6 children    SUMMARY OF RECOMMENDATIONS    PMT meeting scheduled with 6 children & an interpreter for 1/26 at 2:00 pm  Patient has advanced vascular dementia.  A PEG tube for feeding is not medically recommended in advanced dementia as it does not extend life or improve quality of life.     I have requested a Speech Therapy evaluation today as patient is more alert.   Code Status/Advance Care Planning:  Full code (per  notes family feels it would be better to try something rather than nothing)  Psycho-social/Spiritual:   Desire for further Chaplaincy support: yes requested  Prognosis:   Less than 2 weeks pending goals of care.  Patient appears to have the end stages of vascular dementia she is no longer independently eating/drinking, lifting her head or speaking.  She is hospice house eligible if her family chooses to go that route.  Discharge Planning: To Be Determined      Primary Diagnoses: Present on Admission: . Stroke ([place]) . Hypertension . HLD (hyperlipidemia) . Acute metabolic encephalopathy . Dehydration . Hypernatremia . AKI (acute kidney injury) ([place]) . UTI (urinary tract infection) . Protein-calorie malnutrition, severe ([place])   I have reviewed the medical record, interviewed the patient and family, and examined the patient. The following aspects are  pertinent.  Past Medical History:  Diagnosis Date  . Anorexia   . Anxiety   . Cataract   . Coronary artery disease   . Depression   . Diabetes mellitus without complication ([place])   . Dysphasia   . Failure to thrive (0-17)   . Hyperlipidemia   . Hypertension   . Hypokalemia   . Stroke [facility])    R sided paralysis  . Vascular dementia    Social History   Socioeconomic History  . Marital status: Widowed    Spouse name: None  . Number of children: None  . Years of education: None  . Highest education level: None  Social Needs  . Financial resource strain: None  . Food insecurity - worry: None  . Food insecurity - inability: None  . Transportation needs - medical: None  . Transportation needs - non-medical: None  Occupational History  . None  Tobacco Use  . Smoking status: Never Smoker  . Smokeless tobacco: Never Used  Substance and Sexual Activity  . Alcohol use: No    Frequency: Never  . Drug use: No  . Sexual activity: No  Other Topics Concern  . None  Social History Narrative  . None   Family History  Problem Relation Age of Onset  . Uterine cancer Mother   . Diabetes Mellitus II Father    Scheduled Meds: . aspirin  150 mg Rectal Daily  . enoxaparin (LOVENOX) injection  30 mg Subcutaneous Q24H  . insulin aspart  0-9 Units Subcutaneous Q4H   Continuous Infusions: . cefTRIAXone (ROCEPHIN)  IV Stopped (08/15/17 2357)  . dextrose 55 mL (08/16/17 1107)   PRN Meds:.acetaminophen, hydrALAZINE, ondansetron (ZOFRAN) IV No Known Allergies Review of Systems unable to obtain due to advanced dementia  Physical Exam  Frail elderly female, non verbal, awake, NAD, lying on her side.  CV very faint, no obvious m/r/g Resp no distress Abdomen soft, nt, nd  Vital Signs: BP 133/64 (BP Location: Left Arm)   Pulse 69   Temp 98.6 F (37 C) (Axillary)   Resp 18   Wt 40.1 kg (88 lb 6.5 oz)   SpO2 100%  Pain Assessment: No/denies pain     SpO2: SpO2: 100 % O2  Device:SpO2: 100 % O2 Flow Rate: .O2 Flow Rate (L/min): 2 L/min  IO: Intake/output summary:   Intake/Output Summary (Last 24 hours) at 08/16/2017 1203 Last data filed at 08/16/2017 0615 Gross per 24 hour  Intake 434.17 ml  Output 430 ml  Net 4.17 ml    LBM: Last BM Date: 08/15/17(small BM ) Baseline Weight: Weight: 38.1 kg (83 lb 14.4 oz) Most recent weight: Weight: 40.1 kg (  88 lb 6.5 oz)     Palliative Assessment/Data: 20%     Time In: 11:00 Time Out: 11:50 Time Total: 50 min. Greater than 50%  of this time was spent counseling and coordinating care related to the above assessment and plan.  Signed by: Florentina Jenny, PA-C Palliative Medicine Pager: [phone]  Please contact Palliative Medicine Team phone at [phone] for questions and concerns.  For individual provider: See Shea Evans

## 2017-08-16 NOTE — Progress Notes (Signed)
PHARMACIST - PHYSICIAN COMMUNICATION  CONCERNING:  Ceftriaxone   RECOMMENDATION: De-escalate  to ampicillin 1 gram iv Q 12   DESCRIPTION: Urine culture with E Coli sensitive to PCN  Thank you Okey RegalLisa Falon Flinchum, PharmD [phone]

## 2017-08-16 NOTE — Progress Notes (Signed)
K 3.9, this am, Na 162 now, and Chloride is still greater than 130, will notify MD, thanks Lavonda JumboMike F RN.

## 2017-08-16 NOTE — Progress Notes (Signed)
Patient Chloride continues to be elevated >130 and potassium 3.3. Notified X.Blount on call.  Will continue to monitor.  Jerrilynn Mikowski, RN

## 2017-08-16 NOTE — Progress Notes (Addendum)
PROGRESS NOTE    Monique Hefty  EAV:[id number] DOB: 03/11/1941 DOA: 08/14/2017 PCP: Justin Mend, MD   Brief Narrative: Raylea Adcox is a 77 y.o. 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 She presented with altered mental status with severe hypernatremia and acute kidney injury.   Assessment & Plan:   Principal Problem:   Acute metabolic encephalopathy Active Problems:   Stroke (HCC)   Hypertension   Diabetes mellitus without complication (HCC)   HLD (hyperlipidemia)   Dehydration   Hypernatremia   AKI (acute kidney injury) (HCC)   UTI (urinary tract infection)   Protein-calorie malnutrition, severe (HCC)   Acute metabolic encephalopathy Patient already has poor baseline. Currently with severe hypernatremia and elevated BUN. Possibly combination of uremia and hypernatremia. Improved mental status and likely at baseline. -management below  Hypernatremia Patient's sodium of 172 on admission. Down 3 mmol/L in 24 hours -switch to D5 water at 50 ml/hr -Continue BMP q 4 hours until steady decrease achieved  Acute kidney injury Unknown baseline. Improving with IV fluids. BUN improved as well. Minimal urine output. Renal ultrasound without hydronephrosis and there is some urine retention on bladder scan. -Strict in/out -in/out cath prn  Acute urinary retention Management above.  UTI Urine culture significant for pan-sensitive E. Coli -Transitioned to ampicillin per pharmacy protocol  History of stroke -Continue aspirin -SLP evaluation  Vascular dementia Patient is minimally interactive at baseline. She appears baseline today.   Severe protein-calorie malnutrition -Dietitian consulted  Essential hypertension -Continue IV hydralazine prn  Diabetes mellitus, insulin dependent -Discontinue Lantus and continue SSI -Continue D5 fluids  Hyperlipidemia Not  on medications.  Abnormal CT head findings Unknown if patient has had history of surgery per discussion with family. At this time, no urgent workup. Pending goals of care, can follow-up outpatient.  Goals of care Patient is appropriate for hospice. Family is under the idea that trying is better than nothing. Discussed patient's apparent course from my assessment. Baseline is already poor. Patient would likely require tube feeding as she is not taking oral nutrition adequately; pros and cons discussed. Will get palliative care to assist with continued goals of care discussion.   DVT prophylaxis: Lovenox Code Status: Full code Family Communication: Daughter and granddaughter at bedside Disposition Plan: Pending medical improvement   Consultants:   Palliative care medicine  Procedures:   None  Antimicrobials:  Vancomycin (1/23)  Zosyn (1/23)  Ceftriaxone (1/23>>   Subjective: Afebrile overnight. Mental status improved.  Objective: Vitals:   08/15/17 1140 08/15/17 2000 08/16/17 0000 08/16/17 0400  BP: (!) 113/55 109/67 104/72 133/64  Pulse: 87 68 77 69  Resp: 16 20 20 18   Temp: (!) 97.4 F (36.3 C) (!) 97.5 F (36.4 C) (!) 97.3 F (36.3 C) 98.6 F (37 C)  TempSrc: Axillary Axillary Oral Axillary  SpO2: 99% (!) 1% 100% 100%  Weight:    40.1 kg (88 lb 6.5 oz)    Intake/Output Summary (Last 24 hours) at 08/16/2017 1032 Last data filed at 08/16/2017 0615 Gross per 24 hour  Intake 434.17 ml  Output 430 ml  Net 4.17 ml   Filed Weights   08/14/17 2126 08/15/17 0428 08/16/17 0400  Weight: 38.1 kg (83 lb 14.4 oz) 37.6 kg (83 lb) 40.1 kg (88 lb 6.5 oz)    Examination:  General exam: Appears calm and comfortable Respiratory system: Clear to auscultation. Respiratory effort normal. Cardiovascular system: S1 &  S2 heard, RRR. No murmurs. Gastrointestinal system: Abdomen is nondistended, soft and nontender. Normal bowel sounds heard. Central nervous system:  Alert. Extremities: No edema. No calf tenderness Skin: No cyanosis. No rashes    Data Reviewed: I have personally reviewed following labs and imaging studies  CBC: Recent Labs  Lab 08/14/17 1603  WBC 10.4  NEUTROABS 7.1  HGB 15.5*  HCT 50.9*  MCV 98.8  PLT 209   Basic Metabolic Panel: Recent Labs  Lab 08/15/17 1102 08/15/17 1822 08/16/17 0037 08/16/17 0553 08/16/17 0841  NA 163* 163* 163* 162* 160*  K 3.0* 3.4* 3.1* 3.9 4.2  CL >130* >130* >130* >130* >130*  CO2 18* 19* 20* 19* 18*  GLUCOSE 61* 182* 121* 141* 143*  BUN 106* 87* 83* 74* 66*  CREATININE 1.89* 1.61* 1.44* 1.38* 1.32*  CALCIUM 7.8* 7.7* 7.8* 8.0* 7.9*   GFR: CrCl cannot be calculated (Unknown ideal weight.). Liver Function Tests: Recent Labs  Lab 08/14/17 1603  AST 18  ALT 22  ALKPHOS 90  BILITOT 0.8  PROT 8.0  ALBUMIN 3.5   No results for input(s): LIPASE, AMYLASE in the last 168 hours. No results for input(s): AMMONIA in the last 168 hours. Coagulation Profile: No results for input(s): INR, PROTIME in the last 168 hours. Cardiac Enzymes: No results for input(s): CKTOTAL, CKMB, CKMBINDEX, TROPONINI in the last 168 hours. BNP (last 3 results) No results for input(s): PROBNP in the last 8760 hours. HbA1C: Recent Labs    08/15/17 0240  HGBA1C 8.4*   CBG: Recent Labs  Lab 08/15/17 1756 08/15/17 2012 08/15/17 2351 08/16/17 0346 08/16/17 0752  GLUCAP 194* 134* 115* 121* 126*   Lipid Profile: Recent Labs    08/15/17 0240  CHOL 147  HDL 23*  LDLCALC 86  TRIG [id number]*  CHOLHDL 6.4   Thyroid Function Tests: No results for input(s): TSH, T4TOTAL, FREET4, T3FREE, THYROIDAB in the last 72 hours. Anemia Panel: No results for input(s): VITAMINB12, FOLATE, FERRITIN, TIBC, IRON, RETICCTPCT in the last 72 hours. Sepsis Labs: Recent Labs  Lab 08/14/17 1608 08/14/17 1829  LATICACIDVEN 2.12* 1.80    Recent Results (from the past 240 hour(s))  Urine culture     Status: Abnormal    Collection Time: 08/14/17  3:56 PM  Result Value Ref Range Status   Specimen Description URINE, CATHETERIZED  Final   Special Requests Normal  Final   Culture >=100,000 COLONIES/mL ESCHERICHIA COLI (A)  Final   Report Status 08/16/2017 FINAL  Final   Organism ID, Bacteria ESCHERICHIA COLI (A)  Final      Susceptibility   Escherichia coli - MIC*    AMPICILLIN 4 SENSITIVE Sensitive     CEFAZOLIN <=4 SENSITIVE Sensitive     CEFTRIAXONE <=1 SENSITIVE Sensitive     CIPROFLOXACIN <=0.25 SENSITIVE Sensitive     GENTAMICIN <=1 SENSITIVE Sensitive     IMIPENEM <=0.25 SENSITIVE Sensitive     NITROFURANTOIN <=16 SENSITIVE Sensitive     TRIMETH/SULFA <=20 SENSITIVE Sensitive     AMPICILLIN/SULBACTAM <=2 SENSITIVE Sensitive     PIP/TAZO <=4 SENSITIVE Sensitive     Extended ESBL NEGATIVE Sensitive     * >=100,000 COLONIES/mL ESCHERICHIA COLI  Blood Culture (routine x 2)     Status: None (Preliminary result)   Collection Time: 08/14/17  4:25 PM  Result Value Ref Range Status   Specimen Description BLOOD RIGHT HAND  Final   Special Requests   Final    BOTTLES DRAWN AEROBIC AND ANAEROBIC Blood Culture  adequate volume   Culture NO GROWTH < 24 HOURS  Final   Report Status PENDING  Incomplete  Blood Culture (routine x 2)     Status: None (Preliminary result)   Collection Time: 08/14/17  4:58 PM  Result Value Ref Range Status   Specimen Description BLOOD RIGHT FOREARM  Final   Special Requests   Final    BOTTLES DRAWN AEROBIC AND ANAEROBIC Blood Culture adequate volume   Culture NO GROWTH < 24 HOURS  Final   Report Status PENDING  Incomplete  MRSA PCR Screening     Status: None   Collection Time: 08/15/17  3:21 AM  Result Value Ref Range Status   MRSA by PCR NEGATIVE NEGATIVE Final    Comment:        The GeneXpert MRSA Assay (FDA approved for NASAL specimens only), is one component of a comprehensive MRSA colonization surveillance program. It is not intended to diagnose MRSA infection nor  to guide or monitor treatment for MRSA infections.          Radiology Studies: Dg Chest 2 View  Result Date: 08/14/2017 CLINICAL DATA:  Tachycardia and altered mental status. EXAM: CHEST  2 VIEW COMPARISON:  None. FINDINGS: Heart size is top-normal. Moderate aortic atherosclerosis at the arch without aneurysm. Lungs are clear with minimal scarring or atelectasis in the left lower lobe. Left shoulder arthroplasty without complications. Mild degenerate change along the dorsal spine. IMPRESSION: No active cardiopulmonary disease.  Aortic atherosclerosis. Electronically Signed   By: Tollie Eth M.D.   On: 08/14/2017 17:14   Ct Head Wo Contrast  Result Date: 08/14/2017 CLINICAL DATA:  Altered level of consciousness unexplained. History of right-sided paralysis with left-sided facial droop. Patient is nonverbal. EXAM: CT HEAD WITHOUT CONTRAST TECHNIQUE: Contiguous axial images were obtained from the base of the skull through the vertex without intravenous contrast. COMPARISON:  None. FINDINGS: Brain: Frontoparietal left-sided encephalomalacia with volume loss compatible with remote infarcts. Superficial and central atrophy is noted with chronic appearing small vessel ischemic disease. No acute intracranial hemorrhage, midline shift or edema. No intra-axial mass nor extra-axial fluid collections. Vascular: Moderate atherosclerotic calcifications of the cavernous sinus carotids. No hyperdense vessels. Skull: Negative for fracture or focal lesions. Sinuses/Orbits: Mild sphenoid sinus mucosal thickening. Intact globes with unusual serpiginous hyperdensities in the left globe possibly representing a varix. Recommend correlation with patient's prior ophthalmologic procedures and exam. Other: None IMPRESSION: 1. Chronic left MCA and watershed distribution infarcts. No acute intracranial abnormality. Atrophy with chronic small vessel ischemia. 2. Unusual hyperdensities, slightly serpiginous appearance within the  left globe as above. Recommend correlation with patient's prior ophthalmologic procedures and exam. Given maintained spherical appearance of the globe, pthisis bulbi is believed less likely. Varix or stigmata of prior ocular intervention are some considerations. Electronically Signed   By: Tollie Eth M.D.   On: 08/14/2017 17:03   US Renal  Result Date: 08/16/2017 CLINICAL DATA:  Acute kidney injury EXAM: RENAL / URINARY TRACT ULTRASOUND COMPLETE COMPARISON:  None. FINDINGS: Right Kidney: Length: 9.1 cm. Echogenicity within normal limits. No mass or hydronephrosis visualized. Left Kidney: Length: 9.8 cm. Echogenicity within normal limits. No mass or hydronephrosis visualized. Bladder: Slightly thickened wall likely due to under distension IMPRESSION: 1. Negative sonographic appearance of the kidneys 2. Slight bladder wall thickening likely due to under distension Electronically Signed   By: Jasmine Pang M.D.   On: 08/16/2017 00:07        Scheduled Meds: . aspirin  150 mg Rectal  Daily  . enoxaparin (LOVENOX) injection  30 mg Subcutaneous Q24H  . insulin aspart  0-9 Units Subcutaneous Q4H   Continuous Infusions: . cefTRIAXone (ROCEPHIN)  IV Stopped (08/15/17 2357)  . dextrose 5 % and 0.45% NaCl 75 mL/hr at 08/16/17 0549     LOS: 2 days     Jacquelin Hawking, MD [facility] 08/16/2017, 10:32 AM Pager: [phone]  If 7PM-7AM, please contact night-coverage [email or web address] Password [facility] 08/16/2017, 10:32 AM

## 2017-08-17 DIAGNOSIS — I639 Cerebral infarction, unspecified: Secondary | ICD-10-CM

## 2017-08-17 DIAGNOSIS — Z515 Encounter for palliative care: Secondary | ICD-10-CM

## 2017-08-17 DIAGNOSIS — L8990 Pressure ulcer of unspecified site, unspecified stage: Secondary | ICD-10-CM

## 2017-08-17 DIAGNOSIS — Z7189 Other specified counseling: Secondary | ICD-10-CM

## 2017-08-17 LAB — BASIC METABOLIC PANEL
ANION GAP: 12 (ref 5–15)
ANION GAP: 12 (ref 5–15)
ANION GAP: 11 (ref 5–15)
Anion gap: 11 (ref 5–15)
BUN: 39 mg/dL — ABNORMAL HIGH (ref 6–20)
BUN: 34 mg/dL — AB (ref 6–20)
BUN: 31 mg/dL — AB (ref 6–20)
BUN: 24 mg/dL — AB (ref 6–20)
CALCIUM: 8.1 mg/dL — AB (ref 8.9–10.3)
CALCIUM: 8.2 mg/dL — AB (ref 8.9–10.3)
CALCIUM: 7.7 mg/dL — AB (ref 8.9–10.3)
CHLORIDE: 120 mmol/L — AB (ref 101–111)
CO2: 17 mmol/L — ABNORMAL LOW (ref 22–32)
CO2: 18 mmol/L — ABNORMAL LOW (ref 22–32)
CO2: 16 mmol/L — ABNORMAL LOW (ref 22–32)
CO2: 17 mmol/L — AB (ref 22–32)
CREATININE: 0.83 mg/dL (ref 0.44–1.00)
Calcium: 7.8 mg/dL — ABNORMAL LOW (ref 8.9–10.3)
Chloride: 127 mmol/L — ABNORMAL HIGH (ref 101–111)
Chloride: 123 mmol/L — ABNORMAL HIGH (ref 101–111)
Chloride: 124 mmol/L — ABNORMAL HIGH (ref 101–111)
Creatinine, Ser: 0.97 mg/dL (ref 0.44–1.00)
Creatinine, Ser: 0.99 mg/dL (ref 0.44–1.00)
Creatinine, Ser: 0.97 mg/dL (ref 0.44–1.00)
GFR calc Af Amer: >60 mL/min (ref >60)
GFR calc Af Amer: >60 mL/min (ref >60)
GFR calc Af Amer: >60 mL/min (ref >60)
GFR calc Af Amer: >60 mL/min (ref >60)
GFR calc non Af Amer: >60 mL/min (ref >60)
GFR, EST NON AFRICAN AMERICAN: 55 mL/min — AB (ref >60)
GFR, EST NON AFRICAN AMERICAN: 54 mL/min — AB (ref >60)
GFR, EST NON AFRICAN AMERICAN: 55 mL/min — AB (ref >60)
GLUCOSE: 131 mg/dL — AB (ref 65–99)
GLUCOSE: 236 mg/dL — AB (ref 65–99)
GLUCOSE: 229 mg/dL — AB (ref 65–99)
GLUCOSE: 107 mg/dL — AB (ref 65–99)
POTASSIUM: 3 mmol/L — AB (ref 3.5–5.1)
POTASSIUM: 3.7 mmol/L (ref 3.5–5.1)
Potassium: 3.5 mmol/L (ref 3.5–5.1)
Potassium: 3.1 mmol/L — ABNORMAL LOW (ref 3.5–5.1)
SODIUM: 156 mmol/L — AB (ref 135–145)
SODIUM: 151 mmol/L — AB (ref 135–145)
Sodium: 153 mmol/L — ABNORMAL HIGH (ref 135–145)
Sodium: 148 mmol/L — ABNORMAL HIGH (ref 135–145)

## 2017-08-17 LAB — GLUCOSE, CAPILLARY
GLUCOSE-CAPILLARY: 201 mg/dL — AB (ref 65–99)
GLUCOSE-CAPILLARY: 202 mg/dL — AB (ref 65–99)
Glucose-Capillary: 220 mg/dL — ABNORMAL HIGH (ref 65–99)
Glucose-Capillary: 101 mg/dL — ABNORMAL HIGH (ref 65–99)
Glucose-Capillary: 197 mg/dL — ABNORMAL HIGH (ref 65–99)

## 2017-08-17 MED ORDER — MORPHINE SULFATE (PF) 2 MG/ML IV SOLN: 1.0000 mg | INTRAVENOUS | Status: DC | PRN

## 2017-08-17 MED ORDER — ACETAMINOPHEN 650 MG RE SUPP: 650.0000 mg | Freq: Four times a day (QID) | RECTAL | Status: DC | PRN

## 2017-08-17 MED ORDER — ACETAMINOPHEN 325 MG PO TABS: 650.0000 mg | ORAL_TABLET | Freq: Four times a day (QID) | ORAL | Status: DC | PRN

## 2017-08-17 MED ORDER — ORAL CARE MOUTH RINSE
15.0000 mL | Freq: Two times a day (BID) | OROMUCOSAL | Status: DC
  Administered 2017-08-17 – 2017-08-22 (×11): 15 mL via OROMUCOSAL

## 2017-08-17 NOTE — Progress Notes (Signed)
Potassium PHOSPHATE 20 mEq in dextrose 5 % 250 mL infusion  ordered per NP, [place].    Will continue to monitor.  Shaquasia Caponigro, RN

## 2017-08-17 NOTE — Progress Notes (Signed)
PROGRESS NOTE    Pamela Parks  JXB:[id number] DOB: 1941-03-17 DOA: 08/14/2017 PCP: Justin Mend, MD   Brief Narrative: Pamela Parks is a 77 y.o. 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 She presented with altered mental status with severe hypernatremia and acute kidney injury.   Assessment & Plan:   Principal Problem:   Acute metabolic encephalopathy Active Problems:   Stroke (HCC)   Hypertension   Diabetes mellitus without complication (HCC)   HLD (hyperlipidemia)   Dehydration   Hypernatremia   AKI (acute kidney injury) (HCC)   UTI (urinary tract infection)   Protein-calorie malnutrition, severe (HCC)   Palliative care encounter   Pressure injury of skin   Acute metabolic encephalopathy Patient already has poor baseline. Currently with severe hypernatremia and elevated BUN. Possibly combination of uremia and hypernatremia. Improved mental status and likely at baseline. -management below  Hypernatremia Patient's sodium of 172 on admission. Down 10 mmol in last 24 hours -Continue D5 water at 75 ml/hr -BMP q 12 hours  Acute kidney injury Unknown baseline. Improving with IV fluids. BUN improved as well. Renal ultrasound without hydronephrosis and there is some urine retention on bladder scan. -Strict in/out -in/out cath prn  Acute urinary retention Management above.  UTI Urine culture significant for pan-sensitive E. Coli -Transitioned to ampicillin per pharmacy protocol  History of stroke -Continue aspirin -SLP evaluation  Vascular dementia Patient is minimally interactive at baseline. Baseline.  Severe protein-calorie malnutrition -Dietitian recommendations  Essential hypertension -Continue IV hydralazine prn  Diabetes mellitus, insulin dependent -Discontinue Lantus and continue SSI -Continue D5 fluids  Hyperlipidemia Not on  medications.  Abnormal CT head findings Unknown if patient has had history of surgery per discussion with family. Patient with . At this time, no urgent workup. Pending goals of care, can follow-up outpatient.  Goals of care Patient is appropriate for hospice. Family is under the idea that trying is better than nothing. Discussed patient's apparent course from my assessment. Baseline is already poor. Patient would likely require tube feeding as she is not taking oral nutrition adequately; pros and cons discussed.  -palliative care medicine consulted. Meeting planned for today   DVT prophylaxis: Lovenox Code Status: Full code Family Communication: Daughter at bedside Disposition Plan: Pending medical improvement and GOC discussions   Consultants:   Palliative care medicine  Procedures:   None  Antimicrobials:  Vancomycin (1/23)  Zosyn (1/23)  Ceftriaxone (1/23>>   Subjective: Video interpretor: Byrd Hesselbach [phone])  Afebrile overnight.   Objective: Vitals:   08/16/17 1516 08/16/17 2011 08/17/17 0028 08/17/17 0508  BP: 102/65 (!) 142/61 (!) 110/55 115/65  Pulse: 71 88 61 (!) 56  Resp: 18 20 20 20   Temp:  98.4 F (36.9 C) 98 F (36.7 C) 98 F (36.7 C)  TempSrc:  Oral Oral Oral  SpO2: 93% 94% 96% 100%  Weight:    40.5 kg (89 lb 4.6 oz)    Intake/Output Summary (Last 24 hours) at 08/17/2017 1204 Last data filed at 08/17/2017 0527 Gross per 24 hour  Intake 1451 ml  Output 600 ml  Net 851 ml   Filed Weights   08/15/17 0428 08/16/17 0400 08/17/17 0508  Weight: 37.6 kg (83 lb) 40.1 kg (88 lb 6.5 oz) 40.5 kg (89 lb 4.6 oz)    Examination:  General exam: Appears calm and comfortable Respiratory system: Clear to auscultation. Respiratory effort normal. Cardiovascular system: S1 & S2  heard, RRR. No murmurs. Gastrointestinal system: Abdomen is nondistended, soft and nontender. Normal bowel sounds heard. Central nervous system: Alert.spontaneously moves her arms and  head. Extremities: No edema. No calf tenderness Skin: No cyanosis. No rashes    Data Reviewed: I have personally reviewed following labs and imaging studies  CBC: Recent Labs  Lab 08/14/17 1603  WBC 10.4  NEUTROABS 7.1  HGB 15.5*  HCT 50.9*  MCV 98.8  PLT 209   Basic Metabolic Panel: Recent Labs  Lab 08/16/17 1846 08/16/17 2244 08/17/17 0100 08/17/17 0537 08/17/17 1013  NA 159* 156* 156* 153* 151*  K 3.3* 3.5 3.0* 3.7 3.5  CL >130* 127* 127* 123* 124*  CO2 16* 17* 17* 18* 16*  GLUCOSE 117* 124* 131* 236* 229*  BUN 48* 42* 39* 34* 31*  CREATININE 1.03* 0.94 0.97 0.99 0.97  CALCIUM 7.9* 7.9* 8.1* 8.2* 7.7*  MG  --  2.4  --   --   --   PHOS  --  1.9*  --   --   --    GFR: CrCl cannot be calculated (Unknown ideal weight.). Liver Function Tests: Recent Labs  Lab 08/14/17 1603  AST 18  ALT 22  ALKPHOS 90  BILITOT 0.8  PROT 8.0  ALBUMIN 3.5   No results for input(s): LIPASE, AMYLASE in the last 168 hours. No results for input(s): AMMONIA in the last 168 hours. Coagulation Profile: No results for input(s): INR, PROTIME in the last 168 hours. Cardiac Enzymes: No results for input(s): CKTOTAL, CKMB, CKMBINDEX, TROPONINI in the last 168 hours. BNP (last 3 results) No results for input(s): PROBNP in the last 8760 hours. HbA1C: Recent Labs    08/15/17 0240  HGBA1C 8.4*   CBG: Recent Labs  Lab 08/16/17 1205 08/16/17 1630 08/16/17 2009 08/17/17 0508 08/17/17 0752  GLUCAP 166* 119* 121* 201* 202*   Lipid Profile: Recent Labs    08/15/17 0240  CHOL 147  HDL 23*  LDLCALC 86  TRIG 161*  CHOLHDL 6.4   Thyroid Function Tests: No results for input(s): TSH, T4TOTAL, FREET4, T3FREE, THYROIDAB in the last 72 hours. Anemia Panel: No results for input(s): VITAMINB12, FOLATE, FERRITIN, TIBC, IRON, RETICCTPCT in the last 72 hours. Sepsis Labs: Recent Labs  Lab 08/14/17 1608 08/14/17 1829  LATICACIDVEN 2.12* 1.80    Recent Results (from the past 240  hour(s))  Urine culture     Status: Abnormal   Collection Time: 08/14/17  3:56 PM  Result Value Ref Range Status   Specimen Description URINE, CATHETERIZED  Final   Special Requests Normal  Final   Culture >=100,000 COLONIES/mL ESCHERICHIA COLI (A)  Final   Report Status 08/16/2017 FINAL  Final   Organism ID, Bacteria ESCHERICHIA COLI (A)  Final      Susceptibility   Escherichia coli - MIC*    AMPICILLIN 4 SENSITIVE Sensitive     CEFAZOLIN <=4 SENSITIVE Sensitive     CEFTRIAXONE <=1 SENSITIVE Sensitive     CIPROFLOXACIN <=0.25 SENSITIVE Sensitive     GENTAMICIN <=1 SENSITIVE Sensitive     IMIPENEM <=0.25 SENSITIVE Sensitive     NITROFURANTOIN <=16 SENSITIVE Sensitive     TRIMETH/SULFA <=20 SENSITIVE Sensitive     AMPICILLIN/SULBACTAM <=2 SENSITIVE Sensitive     PIP/TAZO <=4 SENSITIVE Sensitive     Extended ESBL NEGATIVE Sensitive     * >=100,000 COLONIES/mL ESCHERICHIA COLI  Blood Culture (routine x 2)     Status: None (Preliminary result)   Collection Time: 08/14/17  4:25 PM  Result Value Ref Range Status   Specimen Description BLOOD RIGHT HAND  Final   Special Requests   Final    BOTTLES DRAWN AEROBIC AND ANAEROBIC Blood Culture adequate volume   Culture NO GROWTH 2 DAYS  Final   Report Status PENDING  Incomplete  Blood Culture (routine x 2)     Status: None (Preliminary result)   Collection Time: 08/14/17  4:58 PM  Result Value Ref Range Status   Specimen Description BLOOD RIGHT FOREARM  Final   Special Requests   Final    BOTTLES DRAWN AEROBIC AND ANAEROBIC Blood Culture adequate volume   Culture NO GROWTH 2 DAYS  Final   Report Status PENDING  Incomplete  MRSA PCR Screening     Status: None   Collection Time: 08/15/17  3:21 AM  Result Value Ref Range Status   MRSA by PCR NEGATIVE NEGATIVE Final    Comment:        The GeneXpert MRSA Assay (FDA approved for NASAL specimens only), is one component of a comprehensive MRSA colonization surveillance program. It is  not intended to diagnose MRSA infection nor to guide or monitor treatment for MRSA infections.          Radiology Studies: [place] Renal  Result Date: 08/16/2017 CLINICAL DATA:  Acute kidney injury EXAM: RENAL / URINARY TRACT ULTRASOUND COMPLETE COMPARISON:  None. FINDINGS: Right Kidney: Length: 9.1 cm. Echogenicity within normal limits. No mass or hydronephrosis visualized. Left Kidney: Length: 9.8 cm. Echogenicity within normal limits. No mass or hydronephrosis visualized. Bladder: Slightly thickened wall likely due to under distension IMPRESSION: 1. Negative sonographic appearance of the kidneys 2. Slight bladder wall thickening likely due to under distension Electronically Signed   By: Jasmine PangKim  Fujinaga M.D.   On: 08/16/2017 00:07        Scheduled Meds: . aspirin  150 mg Rectal Daily  . enoxaparin (LOVENOX) injection  30 mg Subcutaneous Q24H  . insulin aspart  0-9 Units Subcutaneous Q4H  . mouth rinse  15 mL Mouth Rinse BID   Continuous Infusions: . cefTRIAXone (ROCEPHIN)  IV Stopped (08/16/17 2148)  . dextrose 75 mL (08/16/17 2219)     LOS: 3 days     Jacquelin Hawkingalph Unknown Flannigan, MD [facility] 08/17/2017, 12:04 PM Pager: [phone]  If 7PM-7AM, please contact night-coverage [email or web address] Password [email or web address] 08/17/2017, 12:04 PM

## 2017-08-17 NOTE — Evaluation (Signed)
Clinical/Bedside Swallow Evaluation Patient Details  Name: Pamela Parks MRN: [id number] Date of Birth: 1941/01/29  Today's Date: 08/17/2017 Time: SLP Start Time (ACUTE ONLY): 0940 SLP Stop Time (ACUTE ONLY): 1010 SLP Time Calculation (min) (ACUTE ONLY): 30 min  Past Medical History:  Past Medical History:  Diagnosis Date  . Anorexia   . Anxiety   . Cataract   . Coronary artery disease   . Depression   . Diabetes mellitus without complication (HCC)   . Dysphasia   . Failure to thrive (0-17)   . Hyperlipidemia   . Hypertension   . Hypokalemia   . Stroke [facility])    R sided paralysis  . Vascular dementia    Past Surgical History:  Past Surgical History:  Procedure Laterality Date  . left arm     bone fracture   HPI:  Patient is a 77 y.o. female with PMH:  DM, CAD, HLD, HTN, prior CVA with right sided weakness, vascular dementia, non-verbal at baseline. She presented with AMS with severe hypernatremia and acute kidney injury.   Assessment / Plan / Recommendation Clinical Impression  Patient presents with a mod-severe oropharyngeal dysphagia with significant impact from cognitive impairment as patient has had vascular dementia for the past 4 years.  Patient would not move or allow for adequate positioning in bed and so clinician elevated HOB as much as she would tolerate and gave her sips of thin liquid (apple juice) from straw, as she lay on her side and elevated approximately 30-40 degrees. She exhibited delayed swallow initiation, multiple swallows, one delayed cough, but swallow was adequate and patient did accept 7-8 ounces of juice. Patient did not make any attempts to vocalize or verbalize and overall movement was minimal. SLP spoke with and educated patient's daughter via use of iPad Spanish interpreter service, informing her that cannot r/o silent aspiration and that at this time, patient is not able to safely consume any solid texture PO.'s.  SLP Visit Diagnosis: Dysphagia,  oropharyngeal phase (R13.12)    Aspiration Risk  Severe aspiration risk;Risk for inadequate nutrition/hydration    Diet Recommendation NPO;Other (Comment)(NPO except thin liquids as desired)   Liquid Administration via: Straw Medication Administration: Via alternative means Supervision: Full supervision/cueing for compensatory strategies Compensations: Minimize environmental distractions;Slow rate;Small sips/bites Postural Changes: Other (Comment)(HOB elevated as much as patient tolerates with PO's)    Other  Recommendations Oral Care Recommendations: Oral care QID   Follow up Recommendations Other (comment)      Frequency and Duration min 1 x/week(If patient does not go on Hospice care, may benefit from one diet ck/family ed then s/o)  1 week       Prognosis Prognosis for Safe Diet Advancement: Guarded Barriers to Reach Goals: Cognitive deficits;Severity of deficits      Swallow Study   General Date of Onset: 08/14/17 HPI: Patient is a 77 y.o. female with PMH:  DM, CAD, HLD, HTN, prior CVA with right sided weakness, vascular dementia, non-verbal at baseline. She presented with AMS with severe hypernatremia and acute kidney injury. Type of Study: Bedside Swallow Evaluation Previous Swallow Assessment: N/a Diet Prior to this Study: NPO Temperature Spikes Noted: No History of Recent Intubation: No Behavior/Cognition: Requires cueing;Doesn't follow directions;Lethargic/Drowsy Oral Cavity Assessment: Other (comment)(unable to complete, patient would not allow for oral cavity exam) Oral Care Completed by SLP: No Self-Feeding Abilities: Total assist Patient Positioning: Postural control interferes with function;Other (comment)(lying on left side with HOB elevated approximately 30-40 degrees) Baseline Vocal Quality: Aphonic Volitional Cough:  Cognitively unable to elicit Volitional Swallow: Unable to elicit    Oral/Motor/Sensory Function Overall Oral Motor/Sensory Function: Other  (comment)(Patient did not allow for oral mech exam. Based on her current presentation overall, likely patient with mod-severe impairment)   Ice Chips     Thin Liquid Thin Liquid: Impaired Presentation: Straw Oral Phase Impairments: Reduced labial seal Pharyngeal  Phase Impairments: Suspected delayed Swallow;Cough - Delayed;Multiple swallows Other Comments: Patient exhibited only one delayed cough with straw sips of thin liquids (apple juice), but exhibited multiple swallows. Unable to acheive ideal positioning for safety with PO's .    Nectar Thick Nectar Thick Liquid: Not tested   Honey Thick Honey Thick Liquid: Not tested   Puree Puree: Not tested   Solid   GO   Solid: Not tested        Angela NevinJohn T. Preston, MA, CCC-SLP 08/17/17 1:24 PM

## 2017-08-17 NOTE — Progress Notes (Signed)
Daily Progress Note   Patient Name: Pamela Parks       Date: 08/17/2017 DOB: 12/19/1940  Age: 77 y.o. MRN#: [id number] Attending Physician: Mariel Aloe, MD Primary Care Physician: Duffy Bruce, Manya Silvas, MD Admit Date: 08/14/2017  Reason for Consultation/Follow-up: Establishing goals of care, Hospice Evaluation and Psychosocial/spiritual support  Subjective: Patient seen, chart reviewed.  Large family meeting held with presence of Spanish interpreter.  Patient seen by speech and language this morning and she did show moderate dysphasia secondary to cognitive dysfunction but she was able to swallow and drink several ounces of juice without overt aspiration.  Length of Stay: 3  Current Medications: Scheduled Meds:  . aspirin  150 mg Rectal Daily  . enoxaparin (LOVENOX) injection  30 mg Subcutaneous Q24H  . insulin aspart  0-9 Units Subcutaneous Q4H  . mouth rinse  15 mL Mouth Rinse BID    Continuous Infusions: . cefTRIAXone (ROCEPHIN)  IV Stopped (08/16/17 2148)  . dextrose 75 mL (08/16/17 2219)    PRN Meds: acetaminophen **OR** acetaminophen, hydrALAZINE, morphine injection, ondansetron (ZOFRAN) IV  Physical Exam  Constitutional:  Frail elderly female; she is restless, nonverbal  Cardiovascular: Normal rate.  Pulmonary/Chest: Effort normal.  Neurological: She is alert.  Patient is nonverbal Lower extremity contractures Unable to follow commands  Skin: Skin is warm and dry.  Psychiatric:  No acute agitation; psychomotor restlessness observed otherwise unable to test  Nursing note and vitals reviewed.           Vital Signs: BP 115/65 (BP Location: Right Arm)   Pulse (!) 56   Temp 98 F (36.7 C) (Oral)   Resp 20   Wt 40.5 kg (89 lb 4.6 oz)   SpO2 100%  SpO2:  SpO2: 100 % O2 Device: O2 Device: Nasal Cannula O2 Flow Rate: O2 Flow Rate (L/min): 2 L/min  Intake/output summary:   Intake/Output Summary (Last 24 hours) at 08/17/2017 1335 Last data filed at 08/17/2017 0527 Gross per 24 hour  Intake 1451 ml  Output 600 ml  Net 851 ml   LBM: Last BM Date: 08/16/17 Baseline Weight: Weight: 38.1 kg (83 lb 14.4 oz) Most recent weight: Weight: 40.5 kg (89 lb 4.6 oz)       Palliative Assessment/Data:      Patient Active Problem List   Diagnosis  Date Noted  . Pressure injury of skin 08/17/2017  . Palliative care encounter   . HLD (hyperlipidemia) 08/14/2017  . Acute metabolic encephalopathy 94/76/5465  . Dehydration 08/14/2017  . Hypernatremia 08/14/2017  . AKI (acute kidney injury) ([place]) 08/14/2017  . UTI (urinary tract infection) 08/14/2017  . Protein-calorie malnutrition, severe ([place]) 08/14/2017  . Stroke ([place])   . Hypertension   . Diabetes mellitus without complication [facility])     Palliative Care Assessment & Plan   Patient Profile: 77 y.o. female with past medical history of vascular dementia, CVA with residual right sided weakness, DM, who is non-verbal at baseline.  She was admitted on 08/14/2017 with altered mental status from [facility].  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  Labs showed a sodium of 172 and she had acute renal failure with a creatinine of 2.6.  U/A was positive for infection.  Patient is being treated with antibiotics and IV fluids and her labs are improving significantly. Her sodium as of 08/17/17 now 151, creatinine 0.97.  Patient's vascular dementia is quite severe and at this point will be unable to eat and drink on her own.  Consult ordered for goals of care specifically CODE STATUS, artificial feeding, hospice   Assessment: Met with patient's daughter Eustace Quail and daughter [place] via phone and son, [place] (who just flew in from [place]),  Marlene [place] ,[place] interpreter ,for goals of care discussion.  Reviewed the following: Full code versus DNR; pathophysiology of dementia; artificial hydration in the setting of dementia at end of life; hospice care both in the home as well as residential hospice  Son and daughter are quite tearful but verbalize better understanding of what is going on with her mother.  Recommendations/Plan:  Family requested palliative medicine meet with extended family on Monday at 2 PM with interpreter.  They are going to speak together as a family prior to this meeting to decide on [place] and disposition  They did ask for my recommendations which were as follows: DNR, residential hospice  40 of family lives in [place] and they are considering residential hospice but would like to try to get her closer to [facility].  Order placed to social work to help [place] ascertain if there is a residential hospice facility in that community as well as what would be required to transport patient to [place].  She has minimal medical equipment required to make the trip; she is on oxygen via nasal cannula and at this point would probably be capable of making nonemergent transport to [facility] without medical compromise  Goals of Care and Additional Recommendations:  Limitations on Scope of Treatment: Full Scope Treatment  Code Status:    Code Status Orders  (From admission, onward)        Start     Ordered   08/14/17 1947  Full code  Continuous     08/14/17 1946    Code Status History    Date Active Date Inactive Code Status Order ID Comments User Context   This patient has a current code status but no historical code status.       Prognosis:   < 2 weeks in the setting of advanced dementia, dysphasia, hypernatremia, dysphasia.  Patient is at high risk for acute decompensation secondary to aspiration pneumonia  Discharge Planning:  To Be  Determined    Thank you for allowing the Palliative Medicine Team to assist in  the care of this patient.   Time In: 1400 Time Out: 1500 Total Time 60 min Prolonged Time Billed  no       Greater than 50%  of this time was spent counseling and coordinating care related to the above assessment and plan.  Dory Horn, NP  Please contact Palliative Medicine Team phone at [phone] for questions and concerns.

## 2017-08-18 LAB — GLUCOSE, CAPILLARY
GLUCOSE-CAPILLARY: 123 mg/dL — AB (ref 65–99)
GLUCOSE-CAPILLARY: 239 mg/dL — AB (ref 65–99)
Glucose-Capillary: 293 mg/dL — ABNORMAL HIGH (ref 65–99)
Glucose-Capillary: 212 mg/dL — ABNORMAL HIGH (ref 65–99)
Glucose-Capillary: 106 mg/dL — ABNORMAL HIGH (ref 65–99)
Glucose-Capillary: 105 mg/dL — ABNORMAL HIGH (ref 65–99)

## 2017-08-18 LAB — BASIC METABOLIC PANEL
Anion gap: 11 (ref 5–15)
Anion gap: 11 (ref 5–15)
BUN: 21 mg/dL — ABNORMAL HIGH (ref 6–20)
BUN: 18 mg/dL (ref 6–20)
CALCIUM: 7.6 mg/dL — AB (ref 8.9–10.3)
CALCIUM: 7.8 mg/dL — AB (ref 8.9–10.3)
CHLORIDE: 116 mmol/L — AB (ref 101–111)
CO2: 17 mmol/L — ABNORMAL LOW (ref 22–32)
CO2: 15 mmol/L — ABNORMAL LOW (ref 22–32)
CREATININE: 0.77 mg/dL (ref 0.44–1.00)
Chloride: 115 mmol/L — ABNORMAL HIGH (ref 101–111)
Creatinine, Ser: 0.9 mg/dL (ref 0.44–1.00)
GFR calc non Af Amer: >60 mL/min (ref >60)
GLUCOSE: 230 mg/dL — AB (ref 65–99)
Glucose, Bld: 108 mg/dL — ABNORMAL HIGH (ref 65–99)
Potassium: 2.7 mmol/L — CL (ref 3.5–5.1)
Potassium: 4.4 mmol/L (ref 3.5–5.1)
SODIUM: 143 mmol/L (ref 135–145)
Sodium: 142 mmol/L (ref 135–145)

## 2017-08-18 LAB — MAGNESIUM: MAGNESIUM: 1.9 mg/dL (ref 1.7–2.4)

## 2017-08-18 LAB — PHOSPHORUS: Phosphorus: 2 mg/dL — ABNORMAL LOW (ref 2.5–4.6)

## 2017-08-18 MED ORDER — DEXTROSE-NACL 5-0.45 % IV SOLN
INTRAVENOUS | Status: DC
  Administered 2017-08-18: 16:00:00 via INTRAVENOUS

## 2017-08-18 MED ORDER — POTASSIUM CHLORIDE 10 MEQ/100ML IV SOLN
INTRAVENOUS | Status: AC
  Administered 2017-08-18: 10 meq via INTRAVENOUS
  Filled 2017-08-18: qty 100

## 2017-08-18 MED ORDER — POTASSIUM CHLORIDE 10 MEQ/100ML IV SOLN
10.0000 meq | INTRAVENOUS | Status: AC
  Administered 2017-08-18 (×6): 10 meq via INTRAVENOUS

## 2017-08-18 MED ORDER — SODIUM CHLORIDE 0.9 % IV SOLN
1.0000 g | Freq: Two times a day (BID) | INTRAVENOUS | Status: DC
  Administered 2017-08-18 (×2): 1 g via INTRAVENOUS
  Filled 2017-08-18 (×3): qty 1000

## 2017-08-18 MED ORDER — POTASSIUM PHOSPHATE MONOBASIC 500 MG PO TABS: 500.0000 mg | ORAL_TABLET | Freq: Three times a day (TID) | ORAL | Status: DC

## 2017-08-18 MED ORDER — INSULIN GLARGINE 100 UNIT/ML ~~LOC~~ SOLN
4.0000 [IU] | Freq: Every day | SUBCUTANEOUS | Status: DC
  Administered 2017-08-18 – 2017-08-19 (×2): 4 [IU] via SUBCUTANEOUS
  Filled 2017-08-18 (×2): qty 0.04

## 2017-08-18 MED ORDER — SODIUM GLYCEROPHOSPHATE 1 MMOLE/ML IV SOLN
20.0000 mmol | Freq: Once | INTRAVENOUS | Status: AC
  Administered 2017-08-18: 20 mmol via INTRAVENOUS
  Filled 2017-08-18: qty 20

## 2017-08-18 NOTE — Plan of Care (Signed)
  Clinical Measurements: Will remain free from infection 08/18/2017 0303 - Adequate for Discharge by Jeanella Flatteryhomas, Khalik Pewitt T, RN   Nutrition: Adequate nutrition will be maintained 08/18/2017 0303 - Adequate for Discharge by Jeanella Flatteryhomas, Deana Krock T, RN

## 2017-08-18 NOTE — Progress Notes (Signed)
Bladder scan completed, highest amount 104. Patient had episode of urine and stool incontinence- un measurable.

## 2017-08-18 NOTE — Progress Notes (Signed)
PROGRESS NOTE    Pamela Parks  ZOX:[id number] DOB: 14-May-1941 DOA: 08/14/2017 PCP: Justin Mend, MD   Brief Narrative: Pamela Parks is a 77 y.o. 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 She presented with altered mental status with severe hypernatremia and acute kidney injury.   Assessment & Plan:   Principal Problem:   Acute metabolic encephalopathy Active Problems:   Stroke (HCC)   Hypertension   Diabetes mellitus without complication (HCC)   HLD (hyperlipidemia)   Dehydration   Hypernatremia   AKI (acute kidney injury) (HCC)   UTI (urinary tract infection)   Protein-calorie malnutrition, severe (HCC)   Palliative care encounter   Pressure injury of skin   Palliative care by specialist   Goals of care, counseling/discussion   Acute metabolic encephalopathy Patient already has poor baseline. Currently with severe hypernatremia and elevated BUN. Possibly combination of uremia and hypernatremia. Improved mental status and likely at baseline.  Hypernatremia Patient's sodium of 172 on admission. Resolved today. -Continue D5 1/2 NS at 75 ml/hr -BMP daily  Acute kidney injury Unknown baseline. Improving with IV fluids. BUN improved as well. Renal ultrasound without hydronephrosis and there is some urine retention on bladder scan. -Strict in/out -in/out cath prn  Acute urinary retention Management above.  UTI Urine culture significant for pan-sensitive E. Coli -Transitioned to ampicillin  History of stroke -Continue aspirin -SLP evaluation: NPO  Vascular dementia Patient is minimally interactive at baseline. Baseline.  Severe protein-calorie malnutrition -Dietitian recommendations -Daily BMP, phos and mag  Hypokalemia Hypophosphatemia -supplementation as needed  Essential hypertension -Continue IV hydralazine prn  Diabetes mellitus, insulin  dependent -SSI q 4 hours while NPO -Restart Lantus at 4 units daily while on D5 fluids -Continue D5 fluids  Hyperlipidemia Not on medications.  Abnormal CT head findings Unknown if patient has had history of surgery per discussion with family. Patient with . At this time, no urgent workup. Pending goals of care, can follow-up outpatient.  Goals of care Patient is appropriate for hospice. Family is under the idea that trying is better than nothing. Discussed patient's apparent course from my assessment. Baseline is already poor. Patient would likely require tube feeding as she is not taking oral nutrition adequately; pros and cons discussed.  -palliative care medicine consulted. Meeting planned for today   DVT prophylaxis: Lovenox Code Status: Full code Family Communication: None at bedside Disposition Plan: Pending medical improvement and GOC discussions    Consultants:   Palliative care medicine  Procedures:   None  Antimicrobials:  Vancomycin (1/23)  Zosyn (1/23)  Ceftriaxone (1/23>>1/26  Ampicillin (1/27>>   Subjective: No issues overnight. Patient not verbal  Objective: Vitals:   08/17/17 0508 08/17/17 1300 08/17/17 1951 08/18/17 0449  BP: 115/65 115/61 (!) 103/44 (!) 111/44  Pulse: (!) 56 85 84 86  Resp: 20 18 18 18   Temp: 98 F (36.7 C) 97.7 F (36.5 C) 98.1 F (36.7 C) 98.4 F (36.9 C)  TempSrc: Oral Oral Oral Axillary  SpO2: 100% 100% 98% 98%  Weight: 40.5 kg (89 lb 4.6 oz)   41.2 kg (90 lb 13.3 oz)    Intake/Output Summary (Last 24 hours) at 08/18/2017 1131 Last data filed at 08/18/2017 1112 Gross per 24 hour  Intake 2162.5 ml  Output 150 ml  Net 2012.5 ml   Filed Weights   08/16/17 0400 08/17/17 0508 08/18/17 0449  Weight: 40.1 kg (88 lb 6.5 oz)  40.5 kg (89 lb 4.6 oz) 41.2 kg (90 lb 13.3 oz)    Examination:  General exam: Appears calm and comfortable. Cachetic. Respiratory system: Clear to auscultation. Respiratory effort  normal. Cardiovascular system: S1 & S2 heard, RRR. No murmurs. Gastrointestinal system: Abdomen is nondistended, soft and nontender. Normal bowel sounds heard. Central nervous system: Alert. Spontaneously moves her arms and head. Tracks me through the room. Extremities: No edema. No calf tenderness Skin: No cyanosis. No rashes    Data Reviewed: I have personally reviewed following labs and imaging studies  CBC: Recent Labs  Lab 08/14/17 1603  WBC 10.4  NEUTROABS 7.1  HGB 15.5*  HCT 50.9*  MCV 98.8  PLT 209   Basic Metabolic Panel: Recent Labs  Lab 08/16/17 2244 08/17/17 0100 08/17/17 0537 08/17/17 1013 08/17/17 1631 08/18/17 0655  NA 156* 156* 153* 151* 148* 143  K 3.5 3.0* 3.7 3.5 3.1* 2.7*  CL 127* 127* 123* 124* 120* 115*  CO2 17* 17* 18* 16* 17* 17*  GLUCOSE 124* 131* 236* 229* 107* 230*  BUN 42* 39* 34* 31* 24* 21*  CREATININE 0.94 0.97 0.99 0.97 0.83 0.90  CALCIUM 7.9* 8.1* 8.2* 7.7* 7.8* 7.6*  MG 2.4  --   --   --   --  1.9  PHOS 1.9*  --   --   --   --  2.0*   GFR: CrCl cannot be calculated (Unknown ideal weight.). Liver Function Tests: Recent Labs  Lab 08/14/17 1603  AST 18  ALT 22  ALKPHOS 90  BILITOT 0.8  PROT 8.0  ALBUMIN 3.5   No results for input(s): LIPASE, AMYLASE in the last 168 hours. No results for input(s): AMMONIA in the last 168 hours. Coagulation Profile: No results for input(s): INR, PROTIME in the last 168 hours. Cardiac Enzymes: No results for input(s): CKTOTAL, CKMB, CKMBINDEX, TROPONINI in the last 168 hours. BNP (last 3 results) No results for input(s): PROBNP in the last 8760 hours. HbA1C: No results for input(s): HGBA1C in the last 72 hours. CBG: Recent Labs  Lab 08/17/17 1637 08/17/17 2040 08/18/17 0006 08/18/17 0448 08/18/17 0809  GLUCAP 101* 197* 239* 293* 212*   Lipid Profile: No results for input(s): CHOL, HDL, LDLCALC, TRIG, CHOLHDL, LDLDIRECT in the last 72 hours. Thyroid Function Tests: No results for  input(s): TSH, T4TOTAL, FREET4, T3FREE, THYROIDAB in the last 72 hours. Anemia Panel: No results for input(s): VITAMINB12, FOLATE, FERRITIN, TIBC, IRON, RETICCTPCT in the last 72 hours. Sepsis Labs: Recent Labs  Lab 08/14/17 1608 08/14/17 1829  LATICACIDVEN 2.12* 1.80    Recent Results (from the past 240 hour(s))  Urine culture     Status: Abnormal   Collection Time: 08/14/17  3:56 PM  Result Value Ref Range Status   Specimen Description URINE, CATHETERIZED  Final   Special Requests Normal  Final   Culture >=100,000 COLONIES/mL ESCHERICHIA COLI (A)  Final   Report Status 08/16/2017 FINAL  Final   Organism ID, Bacteria ESCHERICHIA COLI (A)  Final      Susceptibility   Escherichia coli - MIC*    AMPICILLIN 4 SENSITIVE Sensitive     CEFAZOLIN <=4 SENSITIVE Sensitive     CEFTRIAXONE <=1 SENSITIVE Sensitive     CIPROFLOXACIN <=0.25 SENSITIVE Sensitive     GENTAMICIN <=1 SENSITIVE Sensitive     IMIPENEM <=0.25 SENSITIVE Sensitive     NITROFURANTOIN <=16 SENSITIVE Sensitive     TRIMETH/SULFA <=20 SENSITIVE Sensitive     AMPICILLIN/SULBACTAM <=2 SENSITIVE Sensitive  PIP/TAZO <=4 SENSITIVE Sensitive     Extended ESBL NEGATIVE Sensitive     * >=100,000 COLONIES/mL ESCHERICHIA COLI  Blood Culture (routine x 2)     Status: None (Preliminary result)   Collection Time: 08/14/17  4:25 PM  Result Value Ref Range Status   Specimen Description BLOOD RIGHT HAND  Final   Special Requests   Final    BOTTLES DRAWN AEROBIC AND ANAEROBIC Blood Culture adequate volume   Culture NO GROWTH 3 DAYS  Final   Report Status PENDING  Incomplete  Blood Culture (routine x 2)     Status: None (Preliminary result)   Collection Time: 08/14/17  4:58 PM  Result Value Ref Range Status   Specimen Description BLOOD RIGHT FOREARM  Final   Special Requests   Final    BOTTLES DRAWN AEROBIC AND ANAEROBIC Blood Culture adequate volume   Culture NO GROWTH 3 DAYS  Final   Report Status PENDING  Incomplete  MRSA  PCR Screening     Status: None   Collection Time: 08/15/17  3:21 AM  Result Value Ref Range Status   MRSA by PCR NEGATIVE NEGATIVE Final    Comment:        The GeneXpert MRSA Assay (FDA approved for NASAL specimens only), is one component of a comprehensive MRSA colonization surveillance program. It is not intended to diagnose MRSA infection nor to guide or monitor treatment for MRSA infections.          Radiology Studies: No results found.      Scheduled Meds: . aspirin  150 mg Rectal Daily  . enoxaparin (LOVENOX) injection  30 mg Subcutaneous Q24H  . insulin aspart  0-9 Units Subcutaneous Q4H  . mouth rinse  15 mL Mouth Rinse BID   Continuous Infusions: . cefTRIAXone (ROCEPHIN)  IV Stopped (08/17/17 2244)  . dextrose 75 mL/hr at 08/17/17 2340  . potassium chloride 10 mEq (08/18/17 1036)  . sodium glycerophosphate 0.9% NaCl IVPB       LOS: 4 days     Jacquelin Hawking, MD [facility] 08/18/2017, 11:31 AM Pager: [phone]  If 7PM-7AM, please contact night-coverage [email or web address] Password [facility] 08/18/2017, 11:31 AM

## 2017-08-18 NOTE — Progress Notes (Signed)
Patient Pamela Parks neuro checks, hard to do on patient as she is nonverbal, unable to follow commands.

## 2017-08-18 NOTE — Progress Notes (Signed)
Notified by lab of patient potassium value 2.7. Dr. Mal MistyNetty notified.

## 2017-08-18 NOTE — Progress Notes (Signed)
CRITICAL VALUE ALERT  Critical Value:  Potassium 2.7  Date & Time Notied:  08/18/17  8:36  Provider Notified: Dr. Mal MistyNetty  Orders Received/Actions taken: waiting for further orders

## 2017-08-19 DIAGNOSIS — R627 Adult failure to thrive: Secondary | ICD-10-CM

## 2017-08-19 DIAGNOSIS — G3183 Dementia with Lewy bodies: Secondary | ICD-10-CM

## 2017-08-19 DIAGNOSIS — F0280 Dementia in other diseases classified elsewhere without behavioral disturbance: Secondary | ICD-10-CM

## 2017-08-19 DIAGNOSIS — Z66 Do not resuscitate: Secondary | ICD-10-CM

## 2017-08-19 LAB — GLUCOSE, CAPILLARY
GLUCOSE-CAPILLARY: 118 mg/dL — AB (ref 65–99)
GLUCOSE-CAPILLARY: 118 mg/dL — AB (ref 65–99)
GLUCOSE-CAPILLARY: 146 mg/dL — AB (ref 65–99)
GLUCOSE-CAPILLARY: 180 mg/dL — AB (ref 65–99)
Glucose-Capillary: 137 mg/dL — ABNORMAL HIGH (ref 65–99)
Glucose-Capillary: 144 mg/dL — ABNORMAL HIGH (ref 65–99)

## 2017-08-19 LAB — BASIC METABOLIC PANEL
ANION GAP: 11 (ref 5–15)
BUN: 13 mg/dL (ref 6–20)
CALCIUM: 7.7 mg/dL — AB (ref 8.9–10.3)
CO2: 11 mmol/L — AB (ref 22–32)
Chloride: 124 mmol/L — ABNORMAL HIGH (ref 101–111)
Creatinine, Ser: 0.58 mg/dL (ref 0.44–1.00)
GLUCOSE: 134 mg/dL — AB (ref 65–99)
POTASSIUM: 5.2 mmol/L — AB (ref 3.5–5.1)
Sodium: 146 mmol/L — ABNORMAL HIGH (ref 135–145)

## 2017-08-19 LAB — CULTURE, BLOOD (ROUTINE X 2)
CULTURE: NO GROWTH
Culture: NO GROWTH
SPECIAL REQUESTS: ADEQUATE
SPECIAL REQUESTS: ADEQUATE

## 2017-08-19 LAB — PHOSPHORUS: PHOSPHORUS: 2.8 mg/dL (ref 2.5–4.6)

## 2017-08-19 LAB — MAGNESIUM: Magnesium: 1.9 mg/dL (ref 1.7–2.4)

## 2017-08-19 MED ORDER — MORPHINE SULFATE (CONCENTRATE) 10 MG/0.5ML PO SOLN
5.0000 mg | Freq: Four times a day (QID) | ORAL | Status: DC
  Administered 2017-08-19 – 2017-08-22 (×11): 5 mg via ORAL
  Filled 2017-08-19 (×12): qty 0.5

## 2017-08-19 NOTE — Progress Notes (Signed)
PROGRESS NOTE    Pamela Parks  NWG:[id number] DOB: July 19, 1941 DOA: 08/14/2017 PCP: Justin Mend, MD   Brief Narrative: Pamela Parks is a 77 y.o. 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 She presented with altered mental status with severe hypernatremia and acute kidney injury.   Assessment & Plan:   Principal Problem:   Acute metabolic encephalopathy Active Problems:   Stroke (HCC)   Hypertension   Diabetes mellitus without complication (HCC)   HLD (hyperlipidemia)   Dehydration   Hypernatremia   AKI (acute kidney injury) (HCC)   UTI (urinary tract infection)   Protein-calorie malnutrition, severe (HCC)   Palliative care encounter   Pressure injury of skin   Palliative care by specialist   Goals of care, counseling/discussion   Acute metabolic encephalopathy Patient already has poor baseline. Currently with severe hypernatremia and elevated BUN. Possibly combination of uremia and hypernatremia. Improved mental status and at baseline.  Hypernatremia Patient's sodium of 172 on admission. Resolved. -Continue D5 1/2 NS at 75 ml/hr -BMP daily  Acute kidney injury Unknown baseline. Improving with IV fluids. BUN improved as well. Renal ultrasound without hydronephrosis and there is some urine retention on bladder scan. -Strict in/out -in/out cath prn  Acute urinary retention Management above. Will likely need foley.  UTI Urine culture significant for pan-sensitive E. Coli -Transitioned to ampicillin. Course completed.  History of stroke -Continue aspirin -SLP evaluation: NPO  Vascular dementia Patient is minimally interactive at baseline. Baseline.  Severe protein-calorie malnutrition -Dietitian recommendations -Daily BMP, phos and mag  Hypokalemia Hypophosphatemia -supplementation as needed  Essential hypertension -Continue IV hydralazine  prn  Diabetes mellitus, insulin dependent -SSI q 4 hours while NPO -Continue Lantus at 4 units daily while on D5 fluids -Continue D5 fluids  Hyperlipidemia Not on medications.  Abnormal CT head findings S/p surgery. At this time, no urgent workup. Pending goals of care, can follow-up outpatient.  Non-anion gap metabolic acidosis Secondary to kidney failure from hypovolemia has remained stable. hyperchloremic as well which has been improving with IV fluids.  Goals of care Patient is appropriate for hospice. Family is under the idea that trying is better than nothing. Discussed patient's apparent course from my assessment. Baseline is already poor. Patient would likely require tube feeding as she is not taking oral nutrition adequately; pros and cons discussed.  -palliative care medicine consulted. Meeting planned for today aftern oon   DVT prophylaxis: Lovenox Code Status: Full code Family Communication: None at bedside Disposition Plan: Pending medical improvement and GOC discussions    Consultants:   Palliative care medicine  Procedures:   None  Antimicrobials:  Vancomycin (1/23)  Zosyn (1/23)  Ceftriaxone (1/23>>1/26  Ampicillin (1/27>>   Subjective: Afebrile.  Objective: Vitals:   08/18/17 1300 08/18/17 2012 08/19/17 0507 08/19/17 0532  BP: (!) 110/51 (!) 123/53 95/63   Pulse: 65 79 75   Resp: 18 18 20    Temp: 97.9 F (36.6 C) 98.2 F (36.8 C) 98.4 F (36.9 C)   TempSrc: Oral Axillary    SpO2: 100% 99% 96%   Weight:    40.4 kg (89 lb 1.1 oz)    Intake/Output Summary (Last 24 hours) at 08/19/2017 0928 Last data filed at 08/19/2017 5784 Gross per 24 hour  Intake 1092.5 ml  Output 550 ml  Net 542.5 ml   Filed Weights   08/17/17 0508 08/18/17 0449 08/19/17 0532  Weight: 40.5 kg (89  lb 4.6 oz) 41.2 kg (90 lb 13.3 oz) 40.4 kg (89 lb 1.1 oz)    Examination:  General exam: Appears calm and comfortable. Cachetic. Respiratory system: Clear to  auscultation. Respiratory effort normal. Cardiovascular system: S1 & S2 heard, RRR. No murmurs. Gastrointestinal system: Abdomen is nondistended, soft and nontender. Normal bowel sounds heard. Central nervous system: Alert.   Data Reviewed: I have personally reviewed following labs and imaging studies  CBC: Recent Labs  Lab 08/14/17 1603  WBC 10.4  NEUTROABS 7.1  HGB 15.5*  HCT 50.9*  MCV 98.8  PLT 209   Basic Metabolic Panel: Recent Labs  Lab 08/16/17 2244  08/17/17 0537 08/17/17 1013 08/17/17 1631 08/18/17 0655 08/18/17 1603  NA 156*   < > 153* 151* 148* 143 142  K 3.5   < > 3.7 3.5 3.1* 2.7* 4.4  CL 127*   < > 123* 124* 120* 115* 116*  CO2 17*   < > 18* 16* 17* 17* 15*  GLUCOSE 124*   < > 236* 229* 107* 230* 108*  BUN 42*   < > 34* 31* 24* 21* 18  CREATININE 0.94   < > 0.99 0.97 0.83 0.90 0.77  CALCIUM 7.9*   < > 8.2* 7.7* 7.8* 7.6* 7.8*  MG 2.4  --   --   --   --  1.9  --   PHOS 1.9*  --   --   --   --  2.0*  --    < > = values in this interval not displayed.   GFR: CrCl cannot be calculated (Unknown ideal weight.). Liver Function Tests: Recent Labs  Lab 08/14/17 1603  AST 18  ALT 22  ALKPHOS 90  BILITOT 0.8  PROT 8.0  ALBUMIN 3.5   No results for input(s): LIPASE, AMYLASE in the last 168 hours. No results for input(s): AMMONIA in the last 168 hours. Coagulation Profile: No results for input(s): INR, PROTIME in the last 168 hours. Cardiac Enzymes: No results for input(s): CKTOTAL, CKMB, CKMBINDEX, TROPONINI in the last 168 hours. BNP (last 3 results) No results for input(s): PROBNP in the last 8760 hours. HbA1C: No results for input(s): HGBA1C in the last 72 hours. CBG: Recent Labs  Lab 08/18/17 1609 08/18/17 2009 08/19/17 0038 08/19/17 0445 08/19/17 0748  GLUCAP 106* 105* 118* 146* 137*   Lipid Profile: No results for input(s): CHOL, HDL, LDLCALC, TRIG, CHOLHDL, LDLDIRECT in the last 72 hours. Thyroid Function Tests: No results for  input(s): TSH, T4TOTAL, FREET4, T3FREE, THYROIDAB in the last 72 hours. Anemia Panel: No results for input(s): VITAMINB12, FOLATE, FERRITIN, TIBC, IRON, RETICCTPCT in the last 72 hours. Sepsis Labs: Recent Labs  Lab 08/14/17 1608 08/14/17 1829  LATICACIDVEN 2.12* 1.80    Recent Results (from the past 240 hour(s))  Urine culture     Status: Abnormal   Collection Time: 08/14/17  3:56 PM  Result Value Ref Range Status   Specimen Description URINE, CATHETERIZED  Final   Special Requests Normal  Final   Culture >=100,000 COLONIES/mL ESCHERICHIA COLI (A)  Final   Report Status 08/16/2017 FINAL  Final   Organism ID, Bacteria ESCHERICHIA COLI (A)  Final      Susceptibility   Escherichia coli - MIC*    AMPICILLIN 4 SENSITIVE Sensitive     CEFAZOLIN <=4 SENSITIVE Sensitive     CEFTRIAXONE <=1 SENSITIVE Sensitive     CIPROFLOXACIN <=0.25 SENSITIVE Sensitive     GENTAMICIN <=1 SENSITIVE Sensitive  IMIPENEM <=0.25 SENSITIVE Sensitive     NITROFURANTOIN <=16 SENSITIVE Sensitive     TRIMETH/SULFA <=20 SENSITIVE Sensitive     AMPICILLIN/SULBACTAM <=2 SENSITIVE Sensitive     PIP/TAZO <=4 SENSITIVE Sensitive     Extended ESBL NEGATIVE Sensitive     * >=100,000 COLONIES/mL ESCHERICHIA COLI  Blood Culture (routine x 2)     Status: None (Preliminary result)   Collection Time: 08/14/17  4:25 PM  Result Value Ref Range Status   Specimen Description BLOOD RIGHT HAND  Final   Special Requests   Final    BOTTLES DRAWN AEROBIC AND ANAEROBIC Blood Culture adequate volume   Culture NO GROWTH 4 DAYS  Final   Report Status PENDING  Incomplete  Blood Culture (routine x 2)     Status: None (Preliminary result)   Collection Time: 08/14/17  4:58 PM  Result Value Ref Range Status   Specimen Description BLOOD RIGHT FOREARM  Final   Special Requests   Final    BOTTLES DRAWN AEROBIC AND ANAEROBIC Blood Culture adequate volume   Culture NO GROWTH 4 DAYS  Final   Report Status PENDING  Incomplete  MRSA  PCR Screening     Status: None   Collection Time: 08/15/17  3:21 AM  Result Value Ref Range Status   MRSA by PCR NEGATIVE NEGATIVE Final    Comment:        The GeneXpert MRSA Assay (FDA approved for NASAL specimens only), is one component of a comprehensive MRSA colonization surveillance program. It is not intended to diagnose MRSA infection nor to guide or monitor treatment for MRSA infections.          Radiology Studies: No results found.      Scheduled Meds: . aspirin  150 mg Rectal Daily  . enoxaparin (LOVENOX) injection  30 mg Subcutaneous Q24H  . insulin aspart  0-9 Units Subcutaneous Q4H  . insulin glargine  4 Units Subcutaneous Daily  . mouth rinse  15 mL Mouth Rinse BID   Continuous Infusions: . ampicillin (OMNIPEN) IV Stopped (08/18/17 2343)  . dextrose 5 % and 0.45% NaCl Stopped (08/19/17 0554)     LOS: 5 days     Jacquelin Hawkingalph Verleen Stuckey, MD [facility] 08/19/2017, 9:28 AM Pager: [phone]  If 7PM-7AM, please contact night-coverage [email or web address] Password [email or web address] 08/19/2017, 9:28 AM
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
## 2017-08-19 NOTE — Clinical Social Work Note (Signed)
Clinical Social Work Assessment  Patient Details  Name: Pamela Parks MRN: [id number] Date of Birth: [birth year]  Date of referral:  08/19/17               Reason for consult:  Facility Placement, End of Life/Hospice, Discharge Planning                Permission sought to share information with:  Facility Sport and exercise psychologist, Family Supports Permission granted to share information::  Yes, Verbal Permission Granted  Name::     Pamela Parks  Agency::  Hospice House  Relationship::  Daughter  Contact Information:  [phone]  Housing/Transportation Living arrangements for the past 2 months:  [place] of Information:  Medical Team, Adult Children Patient Interpreter Needed:  None Criminal Activity/Legal Involvement Pertinent to Current Situation/Hospitalization:  No - Comment as needed Significant Relationships:  Adult Children Lives with:  Facility Resident Do you feel safe going back to the place where you live?  Yes Need for family participation in patient care:  Yes (Comment)  Care giving concerns:  Hospice house referral.   Social Worker assessment / plan:  CSW met with patient. Three children at bedside. Patient's son and daughter, Ameisha can speak [place]. Patient's children have decided they want patient transferred to hospice house close to [place]. CSW called a hospice company in [place] but they said the closest hospice house is in [place]. According to GPS, this is about 32 minutes away from [place]. All patients that need hospice in [facility] go to SNF/ALF/home with hospice. Patient's children agreeable to facility in [place]. CSW will fax referral today. Patient's children will transport patient to the facility due to cost of ambulance from [place] to [place]. No further concerns. CSW encouraged patient's children to contact CSW as needed. CSW will continue to follow patient and her family for support and facilitate  discharge hospice house once referral is reviewed and accepted.  Employment status:  Retired Forensic scientist:  Medicaid In [place] PT Recommendations:  Not assessed at this time Information / Referral to community resources:  Other (Comment Required)(Hospice House)  Patient/Family's Response to care:  Patient not oriented. Patient's children agreeable to hospice house referral. Patient's children supportive and involved in patient's care. Patient's children appreciated social work intervention.  Patient/Family's Understanding of and Emotional Response to Diagnosis, Current Treatment, and Prognosis:  Patient not oriented. Patient's children have a good understanding of the reason for admission and prognosis. Patient's children appear happy with hospital care.  Emotional Assessment Appearance:  Appears stated age Attitude/Demeanor/Rapport:  Unable to Assess Affect (typically observed):  Unable to Assess Orientation:  (UTA) Alcohol / Substance use:  Never Used Psych involvement (Current and /or in the community):  No (Comment)  Discharge Needs  Concerns to be addressed:  Care Coordination Readmission within the last 30 days:  No Current discharge risk:  Cognitively Impaired, Dependent with Mobility, Terminally ill Barriers to Discharge:  Other(Hospice referral in progress)   Candie Chroman, LCSW 08/19/2017, 3:36 PM

## 2017-08-19 NOTE — Progress Notes (Signed)
  Speech Language Pathology Treatment: Dysphagia  Patient Details Name: Pamela Parks MRN: [id number] DOB: October 24, 1940 Today's Date: 08/19/2017 Time: 3976-7341 SLP Time Calculation (min) (ACUTE ONLY): 24 min  Assessment / Plan / Recommendation Clinical Impression  Initially used video interpreter then son ended his phone conversation and stated he spoke [place]. Family agreeable to try purees and thin liquids understanding aspiration risk and comfort feeds. Anterior spillage with cup sip thin; straw sips resulted in immediate and delayed cough. Functional oral manipulation with applesauce without s/s aspiration however cannot rule out silent aspiration from observation. Will initiate puree and thin. Educated son to sit pt upright when eating/drinking, small sips/bites. Pt plans to be transferred to hospice facility. No follow up needed.     HPI HPI: Patient is a 77 y.o. female with PMH:  DM, CAD, HLD, HTN, prior CVA with right sided weakness, vascular dementia, non-verbal at baseline. She presented with AMS with severe hypernatremia and acute kidney injury.      SLP Plan  All goals met;Discharge SLP treatment due to (comment)       Recommendations  Diet recommendations: Dysphagia 1 (puree);Thin liquid Liquids provided via: Cup;Teaspoon Medication Administration: Crushed with puree Supervision: Staff to assist with self feeding;Full supervision/cueing for compensatory strategies Compensations: Minimize environmental distractions;Slow rate;Small sips/bites Postural Changes and/or Swallow Maneuvers: Seated upright 90 degrees                Oral Care Recommendations: Oral care BID Follow up Recommendations: None SLP Visit Diagnosis: Dysphagia, unspecified (R13.10) Plan: All goals met;Discharge SLP treatment due to (comment)                       Houston Siren 08/19/2017, 3:46 PM  Orbie Pyo Colvin Caroli.Ed [employer] [phone]

## 2017-08-19 NOTE — Progress Notes (Signed)
Nutrition Brief Note  Chart reviewed. Pt now transitioning to comfort care. Plan for comfort feeds of dysphagia 1 diet with thin liquids per SLP. Per CSW notes, plan [place] (pt's family is located in [place], [place]  No further nutrition interventions warranted at this time.  Please re-consult as needed.   Pamela Parks, RD, LDN, CDE Pager: [phone] After hours Pager: [phone]

## 2017-08-19 NOTE — Progress Notes (Signed)
Visited with this family via consult for prayer and using [employer] Interpretation services.  Family is Spanish speaking.  Prayed with family and asked if they had any questions regarding my role and assignment.  They spoke that it was okay for me to be present and affirmed their desire for prayer for their loved one.  Several people in the room who are crying and trying to understand the situation.  Chaplain support will remain available as needed.  Please page or call us as needed.    08/19/17 1347  Clinical Encounter Type  Visited With Patient and family together  Visit Type Initial;Psychological support;Spiritual support;Critical Care  Spiritual Encounters  Spiritual Needs Prayer;Emotional

## 2017-08-19 NOTE — Progress Notes (Signed)
No output since midnight. Bladder scan done shows greater that 380 ml. I & out cath done with two RN's,output 350 ml.   Will continue to monitor.

## 2017-08-20 MED ORDER — SODIUM POLYSTYRENE SULFONATE PO POWD: 15.0000 g | Freq: Once | ORAL | Status: DC

## 2017-08-20 MED ORDER — DEXTROSE 5 % IV SOLN: INTRAVENOUS | Status: DC

## 2017-08-20 MED ORDER — SODIUM POLYSTYRENE SULFONATE 15 GM/60ML PO SUSP
15.0000 g | Freq: Once | ORAL | Status: DC
  Filled 2017-08-20: qty 60
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
## 2017-08-20 NOTE — Progress Notes (Signed)
[facility] PROGRESS NOTE    Progress Note  Pamela Parks  ZOX:[id number] DOB: 01-22-1941 DOA: 08/14/2017 PCP: Justin Mend, MD     Brief Narrative:   Pamela Parks is an 77 y.o. female past medical history of diabetes mellitus, prior CVA and right-sided weakness, vascular dementia nonverbal baseline presents with acute encephalopathy in the setting of hypernatremia and acute renal failure.  Assessment/Plan:   Acute metabolic encephalopathy Poor baseline will have discussed with family.  Hyponatremia: On admission sodium was 172 Her IV fluid hydration it was 146.  Acute kidney injury: Resolved with IV fluid hydration.  Hyperkalemia: Possible UTI: urine cultures show E. coli pansensitive.  Vascular dementia: Severe protein caloric malnutrition: Essential hypertension Stroke (HCC) Severe protein-calorie malnutrition Hypokalemia Hypophosphatemia Essential hypertension Diabetes mellitus, insulin dependent Non-anion gap metabolic acidosis  Goals of care: Palliative care was consulted the patient was made DNR/DNI, no artificial feeding or hydration Family expressed wishes for the patient to be transitioned to hospice, at a hospice facility near [place]. Awaiting placement at hospice facility.   DVT prophylaxis: lovenox Family Communication:daughte Disposition Plan/Barrier to D/C: awaiting  placement Code Status:     Code Status Orders  (From admission, onward)        Start     Ordered   08/19/17 1520  Do not attempt resuscitation (DNR)  Continuous    Question Answer Comment  In the event of cardiac or respiratory ARREST Do not call a "code blue"   In the event of cardiac or respiratory ARREST Do not perform Intubation, CPR, defibrillation or ACLS   In the event of cardiac or respiratory ARREST Use medication by any route, position, wound care, and other measures to relive pain and suffering. May use oxygen, suction and manual treatment of  airway obstruction as needed for comfort.      08/19/17 1520    Code Status History    Date Active Date Inactive Code Status Order ID Comments User Context   08/14/2017 19:46 08/19/2017 15:20 Full Code [id number]  Lorretta Harp, MD ED        IV Access:    Peripheral IV   Procedures and diagnostic studies:   No results found.   Medical Consultants:    None.  Anti-Infectives:   none  Subjective:    Pamela Parks nonverbal.  Objective:    Vitals:   08/19/17 0532 08/19/17 0936 08/19/17 1204 08/19/17 2224  BP:  (!) 137/49 (!) 107/51 (!) 119/57  Pulse:  (!) 58 (!) 59 (!) 101  Resp:  14 14 16   Temp:  97.8 F (36.6 C) 98 F (36.7 C) 99 F (37.2 C)  TempSrc:  Axillary Axillary Oral  SpO2:  100% 100% 94%  Weight: 40.4 kg (89 lb 1.1 oz)       Intake/Output Summary (Last 24 hours) at 08/20/2017 1032 Last data filed at 08/20/2017 0951 Gross per 24 hour  Intake 20 ml  Output 750 ml  Net -730 ml   Filed Weights   08/17/17 0508 08/18/17 0449 08/19/17 0532  Weight: 40.5 kg (89 lb 4.6 oz) 41.2 kg (90 lb 13.3 oz) 40.4 kg (89 lb 1.1 oz)    Exam: General exam: Family refused physical exam.   Data Reviewed:    Labs: Basic Metabolic Panel: Recent Labs  Lab 08/16/17 2244  08/17/17 1013 08/17/17 1631 08/18/17 0655 08/18/17 1603 08/19/17 0741  NA 156*   < > 151* 148* 143 142 146*  K 3.5   < > 3.5  3.1* 2.7* 4.4 5.2*  CL 127*   < > 124* 120* 115* 116* 124*  CO2 17*   < > 16* 17* 17* 15* 11*  GLUCOSE 124*   < > 229* 107* 230* 108* 134*  BUN 42*   < > 31* 24* 21* 18 13  CREATININE 0.94   < > 0.97 0.83 0.90 0.77 0.58  CALCIUM 7.9*   < > 7.7* 7.8* 7.6* 7.8* 7.7*  MG 2.4  --   --   --  1.9  --  1.9  PHOS 1.9*  --   --   --  2.0*  --  2.8   < > = values in this interval not displayed.   GFR CrCl cannot be calculated (Unknown ideal weight.). Liver Function Tests: Recent Labs  Lab 08/14/17 1603  AST 18  ALT 22  ALKPHOS 90  BILITOT 0.8  PROT 8.0  ALBUMIN  3.5   No results for input(s): LIPASE, AMYLASE in the last 168 hours. No results for input(s): AMMONIA in the last 168 hours. Coagulation profile No results for input(s): INR, PROTIME in the last 168 hours.  CBC: Recent Labs  Lab 08/14/17 1603  WBC 10.4  NEUTROABS 7.1  HGB 15.5*  HCT 50.9*  MCV 98.8  PLT 209   Cardiac Enzymes: No results for input(s): CKTOTAL, CKMB, CKMBINDEX, TROPONINI in the last 168 hours. BNP (last 3 results) No results for input(s): PROBNP in the last 8760 hours. CBG: Recent Labs  Lab 08/19/17 0038 08/19/17 0445 08/19/17 0748 08/19/17 1202 08/19/17 1636  GLUCAP 118* 146* 137* 180* 144*   D-Dimer: No results for input(s): DDIMER in the last 72 hours. Hgb A1c: No results for input(s): HGBA1C in the last 72 hours. Lipid Profile: No results for input(s): CHOL, HDL, LDLCALC, TRIG, CHOLHDL, LDLDIRECT in the last 72 hours. Thyroid function studies: No results for input(s): TSH, T4TOTAL, T3FREE, THYROIDAB in the last 72 hours.  Invalid input(s): FREET3 Anemia work up: No results for input(s): VITAMINB12, FOLATE, FERRITIN, TIBC, IRON, RETICCTPCT in the last 72 hours. Sepsis Labs: Recent Labs  Lab 08/14/17 1603 08/14/17 1608 08/14/17 1829  WBC 10.4  --   --   LATICACIDVEN  --  2.12* 1.80   Microbiology Recent Results (from the past 240 hour(s))  Urine culture     Status: Abnormal   Collection Time: 08/14/17  3:56 PM  Result Value Ref Range Status   Specimen Description URINE, CATHETERIZED  Final   Special Requests Normal  Final   Culture >=100,000 COLONIES/mL ESCHERICHIA COLI (A)  Final   Report Status 08/16/2017 FINAL  Final   Organism ID, Bacteria ESCHERICHIA COLI (A)  Final      Susceptibility   Escherichia coli - MIC*    AMPICILLIN 4 SENSITIVE Sensitive     CEFAZOLIN <=4 SENSITIVE Sensitive     CEFTRIAXONE <=1 SENSITIVE Sensitive     CIPROFLOXACIN <=0.25 SENSITIVE Sensitive     GENTAMICIN <=1 SENSITIVE Sensitive     IMIPENEM <=0.25  SENSITIVE Sensitive     NITROFURANTOIN <=16 SENSITIVE Sensitive     TRIMETH/SULFA <=20 SENSITIVE Sensitive     AMPICILLIN/SULBACTAM <=2 SENSITIVE Sensitive     PIP/TAZO <=4 SENSITIVE Sensitive     Extended ESBL NEGATIVE Sensitive     * >=100,000 COLONIES/mL ESCHERICHIA COLI  Blood Culture (routine x 2)     Status: None   Collection Time: 08/14/17  4:25 PM  Result Value Ref Range Status   Specimen Description BLOOD RIGHT HAND  Final   Special Requests   Final    BOTTLES DRAWN AEROBIC AND ANAEROBIC Blood Culture adequate volume   Culture NO GROWTH 5 DAYS  Final   Report Status 08/19/2017 FINAL  Final  Blood Culture (routine x 2)     Status: None   Collection Time: 08/14/17  4:58 PM  Result Value Ref Range Status   Specimen Description BLOOD RIGHT FOREARM  Final   Special Requests   Final    BOTTLES DRAWN AEROBIC AND ANAEROBIC Blood Culture adequate volume   Culture NO GROWTH 5 DAYS  Final   Report Status 08/19/2017 FINAL  Final  MRSA PCR Screening     Status: None   Collection Time: 08/15/17  3:21 AM  Result Value Ref Range Status   MRSA by PCR NEGATIVE NEGATIVE Final    Comment:        The GeneXpert MRSA Assay (FDA approved for NASAL specimens only), is one component of a comprehensive MRSA colonization surveillance program. It is not intended to diagnose MRSA infection nor to guide or monitor treatment for MRSA infections.      Medications:   . mouth rinse  15 mL Mouth Rinse BID  . morphine CONCENTRATE  5 mg Oral Q6H   Continuous Infusions: . dextrose 5 % and 0.45% NaCl 10 mL/hr (08/19/17 1704)      LOS: 6 days   Marinda Elk  [facility] Pager [phone]  *Please refer to [email or web address], password [email or web address] to get updated schedule on who will round on this patient, as hospitalists switch teams weekly. If 7PM-7AM, please contact night-coverage at [email or web address], password [email or web address] for any overnight needs.  08/20/2017, 10:32 AM

## 2017-08-20 NOTE — Clinical Social Work Note (Addendum)
CSW spoke with [facility] in [place]. They said that based on the notes that were faxed over, patient does not meet criteria for an inpatient hospice facility. They need documentation showing need for symptoms management and that she cannot be managed at home or a SNF. CSW paged Lorinda CreedMary Larach, NP with palliative team to notify.  Charlynn CourtSarah Jakyia Gaccione, CSW [phone]  4:42 pm CSW faxed today's palliative note to [facility].  Charlynn CourtSarah Tamisha Nordstrom, CSW [phone]

## 2017-08-21 DIAGNOSIS — G8929 Other chronic pain: Secondary | ICD-10-CM

## 2017-08-21 DIAGNOSIS — R52 Pain, unspecified: Secondary | ICD-10-CM
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
## 2017-08-21 NOTE — Clinical Social Work Note (Addendum)
[facility] will review referral with their MD. Manager CSW spoke with stated that average length of stay is typically 10 days and once symptoms were under control she would have to have a plan for home vs. SNF. CSW inquired why this is the case when her prognosis is <2 weeks. Manager said she would review with MD and call back.  Charlynn CourtSarah Shaw Dobek, CSW [phone]  10:33 am CSW received voicemail from Fannie KneeSue at [facility] stating that their MD is requesting 72 hours worth of medical records and wants to know if patient is still stable enough to transport to the facility by car. CSW faxed requested information. Discussed transport question with Lorinda CreedMary Larach, NP with palliative team. She will go see patient, discuss with family, and follow up.   Charlynn CourtSarah Tristen Pennino, CSW [phone]

## 2017-08-21 NOTE — Progress Notes (Signed)
PROGRESS NOTE    Pamela Parks  ZOX:[id number] DOB: 01/26/1941 DOA: 08/14/2017 PCP: Justin Mend, MD   Brief Narrative:  Pamela Parks is an 77 y.o. female past medical history of diabetes mellitus, prior CVA and right-sided weakness, vascular dementia nonverbal baseline presents with acute encephalopathy in the setting of hypernatremia and acute renal failure. Progressively worsening and Palliative Care Consulted. GOC meeting and Patient transitioning toward Comfort Care and awaiting Hospice Facility Placement.  Assessment & Plan:   Principal Problem:   Acute metabolic encephalopathy Active Problems:   Stroke (HCC)   Hypertension   Diabetes mellitus without complication (HCC)   HLD (hyperlipidemia)   Dehydration   Hypernatremia   AKI (acute kidney injury) (HCC)   UTI (urinary tract infection)   Protein-calorie malnutrition, severe (HCC)   Palliative care encounter   Pressure injury of skin   Palliative care by specialist   Goals of care, counseling/discussion   DNR (do not resuscitate)   Lewy body dementia without behavioral disturbance   Adult failure to thrive   Chronic generalized pain  Acute metabolic encephalopathy in the setting of Vascular Dementia -Poor baseline  -Patient transitioning toward Comfort Care  Hyponatremia: On admission sodium was 172 Her IV fluid hydration it was 146.  Acute kidney injury: Resolved with IV fluid hydration.  Hyperkalemia -Will not repeat CMP as patient now Comfort  Possible UTI: -urine cultures show E. coli pansensitive. -Will not treat   Essential Hypertension -Patient now Comfort Care  Stroke [facility]) -Patient now Comfort Care  Severe Protein-Calorie Malnutrition -All medications not related to patient's Comfort D/C'd -Will not check CBG's  Hypophosphatemia -Will not repeat Phos as patient now Comfort  Diabetes Mellitus, Insulin Dependent -All medications not related to patient's Comfort D/C'd -Will  not check CBG's  Non-anion gap metabolic acidosis -Will not repeat CMP as patient now Comfort  Goals of care: -Palliative care was consulted the patient was made DNR/DNI, no artificial feeding or hydration -Family expressed wishes for the patient to be transitioned to hospice, at a hospice facility near [place]. Awaiting placement at hospice facility. -All medications not related to Patient's Comfort D/C'd   DVT prophylaxis: None; Patient is Comfort Care Code Status: DO NOT RESUSCITATE Family Communication: Discussed with the Daughter at bedside Disposition Plan: Anticipate D/C to Hospice Facility when Bed Available; Currently still awaiting placement  Consultants:   Palliative Care Medicine    Procedures: None    Antimicrobials:  Anti-infectives (From admission, onward)   Start     Dose/Rate Route Frequency Ordered Stop   08/18/17 1200  ampicillin (OMNIPEN) 1 g in sodium chloride 0.9 % 50 mL IVPB  Status:  Discontinued     1 g 150 mL/hr over 20 Minutes Intravenous 2 times daily 08/18/17 1134 08/19/17 0934   08/16/17 1730  vancomycin (VANCOCIN) 500 mg in sodium chloride 0.9 % 100 mL IVPB  Status:  Discontinued     500 mg 100 mL/hr over 60 Minutes Intravenous Every 48 hours 08/14/17 1834 08/14/17 1940   08/14/17 2200  piperacillin-tazobactam (ZOSYN) IVPB 2.25 g  Status:  Discontinued     2.25 g 100 mL/hr over 30 Minutes Intravenous Every 6 hours 08/14/17 1834 08/14/17 1940   08/14/17 2200  cefTRIAXone (ROCEPHIN) 1 g in dextrose 5 % 50 mL IVPB  Status:  Discontinued     1 g 100 mL/hr over 30 Minutes Intravenous Every 24 hours 08/14/17 1942 08/18/17 1134   08/14/17 1630  piperacillin-tazobactam (ZOSYN) IVPB 3.375 g  3.375 g 100 mL/hr over 30 Minutes Intravenous  Once 08/14/17 1623 08/14/17 1706   08/14/17 1630  vancomycin (VANCOCIN) IVPB 1000 mg/200 mL premix     1,000 mg 200 mL/hr over 60 Minutes Intravenous  Once 08/14/17 1623 08/14/17 1837     Subjective: Seen  and examined and is non-verbal. Currently in NAD.   Objective: Vitals:   08/20/17 1416 08/20/17 2229 08/21/17 0619 08/21/17 1232  BP: (!) 136/56 (!) 157/75  (!) 141/65  Pulse: 88 92  96  Resp: 16 16    Temp: (!) 97.5 F (36.4 C) 98.4 F (36.9 C)    TempSrc: Axillary Oral    SpO2: 94% 95%  100%  Weight:   38.5 kg (84 lb 14 oz)     Intake/Output Summary (Last 24 hours) at 08/21/2017 1534 Last data filed at 08/21/2017 0643 Gross per 24 hour  Intake 10 ml  Output 1050 ml  Net -1040 ml   Filed Weights   08/18/17 0449 08/19/17 0532 08/21/17 0619  Weight: 41.2 kg (90 lb 13.3 oz) 40.4 kg (89 lb 1.1 oz) 38.5 kg (84 lb 14 oz)   Examination: Physical Exam:  Constitutional: Thin female AD and appears calm and comfortable Eyes: Lids normal ENMT: External Ears, Nose appear normal.   Neck: Appears normal, supple, no cervical masses, normal ROM, no appreciable thyromegaly, no JVD Respiratory: Diminished to auscultation bilaterally, no wheezing, rales, rhonchi or crackles. Normal respiratory effort and patient is not tachypenic. No accessory muscle use.  Cardiovascular: RRR, no murmurs / rubs / gallops. S1 and S2 auscultated. No extremity edema. Abdomen: Soft, non-tender, non-distended. No masses palpated. No appreciable hepatosplenomegaly. Bowel sounds positive x4.  GU: Deferred. Musculoskeletal: No clubbing / cyanosis of digits/nails. No joint deformity upper and lower extremities.   Skin: No rashes, lesions, ulcers on a limited skin eval. No induration; Warm and dry.  Neurologic: Non-verbal. Does not follow commands  Psychiatric: Impaired judgement and insight.   Data Reviewed: I have personally reviewed following labs and imaging studies  CBC: Recent Labs  Lab 08/14/17 1603  WBC 10.4  NEUTROABS 7.1  HGB 15.5*  HCT 50.9*  MCV 98.8  PLT 209   Basic Metabolic Panel: Recent Labs  Lab 08/16/17 2244  08/17/17 1013 08/17/17 1631 08/18/17 0655 08/18/17 1603 08/19/17 0741  NA  156*   < > 151* 148* 143 142 146*  K 3.5   < > 3.5 3.1* 2.7* 4.4 5.2*  CL 127*   < > 124* 120* 115* 116* 124*  CO2 17*   < > 16* 17* 17* 15* 11*  GLUCOSE 124*   < > 229* 107* 230* 108* 134*  BUN 42*   < > 31* 24* 21* 18 13  CREATININE 0.94   < > 0.97 0.83 0.90 0.77 0.58  CALCIUM 7.9*   < > 7.7* 7.8* 7.6* 7.8* 7.7*  MG 2.4  --   --   --  1.9  --  1.9  PHOS 1.9*  --   --   --  2.0*  --  2.8   < > = values in this interval not displayed.   GFR: CrCl cannot be calculated (Unknown ideal weight.). Liver Function Tests: Recent Labs  Lab 08/14/17 1603  AST 18  ALT 22  ALKPHOS 90  BILITOT 0.8  PROT 8.0  ALBUMIN 3.5   No results for input(s): LIPASE, AMYLASE in the last 168 hours. No results for input(s): AMMONIA in the last 168 hours. Coagulation Profile:  No results for input(s): INR, PROTIME in the last 168 hours. Cardiac Enzymes: No results for input(s): CKTOTAL, CKMB, CKMBINDEX, TROPONINI in the last 168 hours. BNP (last 3 results) No results for input(s): PROBNP in the last 8760 hours. HbA1C: No results for input(s): HGBA1C in the last 72 hours. CBG: Recent Labs  Lab 08/19/17 0038 08/19/17 0445 08/19/17 0748 08/19/17 1202 08/19/17 1636  GLUCAP 118* 146* 137* 180* 144*   Lipid Profile: No results for input(s): CHOL, HDL, LDLCALC, TRIG, CHOLHDL, LDLDIRECT in the last 72 hours. Thyroid Function Tests: No results for input(s): TSH, T4TOTAL, FREET4, T3FREE, THYROIDAB in the last 72 hours. Anemia Panel: No results for input(s): VITAMINB12, FOLATE, FERRITIN, TIBC, IRON, RETICCTPCT in the last 72 hours. Sepsis Labs: Recent Labs  Lab 08/14/17 1608 08/14/17 1829  LATICACIDVEN 2.12* 1.80    Recent Results (from the past 240 hour(s))  Urine culture     Status: Abnormal   Collection Time: 08/14/17  3:56 PM  Result Value Ref Range Status   Specimen Description URINE, CATHETERIZED  Final   Special Requests Normal  Final   Culture >=100,000 COLONIES/mL ESCHERICHIA COLI (A)   Final   Report Status 08/16/2017 FINAL  Final   Organism ID, Bacteria ESCHERICHIA COLI (A)  Final      Susceptibility   Escherichia coli - MIC*    AMPICILLIN 4 SENSITIVE Sensitive     CEFAZOLIN <=4 SENSITIVE Sensitive     CEFTRIAXONE <=1 SENSITIVE Sensitive     CIPROFLOXACIN <=0.25 SENSITIVE Sensitive     GENTAMICIN <=1 SENSITIVE Sensitive     IMIPENEM <=0.25 SENSITIVE Sensitive     NITROFURANTOIN <=16 SENSITIVE Sensitive     TRIMETH/SULFA <=20 SENSITIVE Sensitive     AMPICILLIN/SULBACTAM <=2 SENSITIVE Sensitive     PIP/TAZO <=4 SENSITIVE Sensitive     Extended ESBL NEGATIVE Sensitive     * >=100,000 COLONIES/mL ESCHERICHIA COLI  Blood Culture (routine x 2)     Status: None   Collection Time: 08/14/17  4:25 PM  Result Value Ref Range Status   Specimen Description BLOOD RIGHT HAND  Final   Special Requests   Final    BOTTLES DRAWN AEROBIC AND ANAEROBIC Blood Culture adequate volume   Culture NO GROWTH 5 DAYS  Final   Report Status 08/19/2017 FINAL  Final  Blood Culture (routine x 2)     Status: None   Collection Time: 08/14/17  4:58 PM  Result Value Ref Range Status   Specimen Description BLOOD RIGHT FOREARM  Final   Special Requests   Final    BOTTLES DRAWN AEROBIC AND ANAEROBIC Blood Culture adequate volume   Culture NO GROWTH 5 DAYS  Final   Report Status 08/19/2017 FINAL  Final  MRSA PCR Screening     Status: None   Collection Time: 08/15/17  3:21 AM  Result Value Ref Range Status   MRSA by PCR NEGATIVE NEGATIVE Final    Comment:        The GeneXpert MRSA Assay (FDA approved for NASAL specimens only), is one component of a comprehensive MRSA colonization surveillance program. It is not intended to diagnose MRSA infection nor to guide or monitor treatment for MRSA infections.     Radiology Studies: No results found.   Scheduled Meds: . mouth rinse  15 mL Mouth Rinse BID  . morphine CONCENTRATE  5 mg Oral Q6H   Continuous Infusions:   LOS: 7 days   Merlene Laughter, DO [facility] Pager [phone]  If  7PM-7AM, please contact night-coverage [email or web address] Password [facility] 08/21/2017, 3:34 PM

## 2017-08-22 MED ORDER — MORPHINE SULFATE (CONCENTRATE) 10 MG/0.5ML PO SOLN: 5.0000 mg | Freq: Four times a day (QID) | ORAL | Status: DC

## 2017-08-22 MED ORDER — MORPHINE SULFATE (CONCENTRATE) 10 MG/0.5ML PO SOLN: 5.0000 mg | Freq: Four times a day (QID) | ORAL | Status: AC

## 2017-08-22 NOTE — Clinical Social Work Note (Signed)
CSW facilitated patient discharge including contacting patient family and facility to confirm patient discharge plans. Clinical information faxed to facility and family agreeable with plan. CSW arranged [facility]. RN to call report prior to discharge [phone]).  CSW will sign off for now as social work intervention is no longer needed. Please consult us again if new needs arise.  Charlynn CourtSarah Jaidin Richison, CSW [phone]

## 2017-08-22 NOTE — Discharge Summary (Signed)
Physician Discharge Summary  Pamela Parks ZOX:[id number] DOB: 18-Mar-1941 DOA: 08/14/2017  PCP: Justin Mend, MD  Admit date: 08/14/2017 Discharge date: 08/22/2017  Admitted From: Home Disposition: [facility]  Recommendations for Outpatient Follow-up:  1. Follow up Care per Hospice Protocol  Home Health: No Equipment/Devices: None   Discharge Condition: Guarded CODE STATUS: Comfort Care, DNR Diet recommendation: Sips of Thin Liquids  Brief/Interim Summary: Pamela Parks an 77 y.o.femalepast medical history of diabetes mellitus, prior CVA and right-sided weakness, vascular dementia nonverbal baseline presented with acute encephalopathy in the setting of hypernatremia and acute renal failure. Found to also have a UTI. Progressively worsening and Palliative Care Consulted. GOC meeting done and Patient transitioning toward Comfort Care and awaiting Hospice Facility Placement. Patient accepted at [facility] and deemed stable to D/C with further care per Hospice Protocol.  Discharge Diagnoses:  Principal Problem:   Acute metabolic encephalopathy Active Problems:   Stroke (HCC)   Hypertension   Diabetes mellitus without complication (HCC)   HLD (hyperlipidemia)   Dehydration   Hypernatremia   AKI (acute kidney injury) (HCC)   UTI (urinary tract infection)   Protein-calorie malnutrition, severe (HCC)   Palliative care encounter   Pressure injury of skin   Palliative care by specialist   Goals of care, counseling/discussion   DNR (do not resuscitate)   Lewy body dementia without behavioral disturbance   Adult failure to thrive   Chronic generalized pain  Acute metabolic encephalopathy in the setting of Vascular Dementia -Poor baseline  -Patient transitioning toward Comfort Care  Hyponatremia: On admission sodium was 172 Her IV fluid hydration it was 146.  Acute kidney injury: Resolved with IV fluid hydration.  Hyperkalemia -Will not repeat CMP as  patient now Comfort  Possible UTI: -urine cultures show E. coli pansensitive. -Will not treat   Essential Hypertension -Patient now Comfort Care  Stroke [facility]) -Patient now Comfort Care  Severe Protein-Calorie Malnutrition -All medications not related to patient's Comfort D/C'd -Will not check CBG's  Hypophosphatemia -Will not repeat Phos as patient now Comfort  Diabetes Mellitus, Insulin Dependent -All medications not related to patient's Comfort D/C'd -Will not check CBG's  Non-anion gap metabolic acidosis -Will not repeat CMP as patient now Comfort  Goals of care: -Palliative care was consulted the patient was made DNR/DNI, no artificial feeding or hydration -Family expressed wishes for the patient to be transitioned to hospice, at a hospice facility near [place]. Awaiting placement at hospice facility. -All medications not related to Patient's Comfort D/C'd   Discharge Instructions Discharge Instructions    Call MD for:  difficulty breathing, headache or visual disturbances   Complete by:  As directed    Call MD for:  extreme fatigue   Complete by:  As directed    Call MD for:  hives   Complete by:  As directed    Call MD for:  persistant dizziness or light-headedness   Complete by:  As directed    Call MD for:  persistant nausea and vomiting   Complete by:  As directed    Call MD for:  redness, tenderness, or signs of infection (pain, swelling, redness, odor or green/yellow discharge around incision site)   Complete by:  As directed    Call MD for:  severe uncontrolled pain   Complete by:  As directed    Call MD for:  temperature >100.4   Complete by:  As directed    Diet - low sodium heart healthy   Complete by:  As directed    Sips with Thin Liquids   Discharge instructions   Complete by:  As directed    Follow up care per Hospice Protocol   Increase activity slowly   Complete by:  As directed      Allergies as of 08/22/2017   No Known  Allergies     Medication List    STOP taking these medications   aspirin 81 MG chewable tablet   CERTAGEN PO   dronabinol 2.5 MG capsule Commonly known as:  MARINOL   insulin glargine 100 UNIT/ML injection Commonly known as:  LANTUS   insulin lispro 100 UNIT/ML injection Commonly known as:  HUMALOG   mirtazapine 7.5 MG tablet Commonly known as:  REMERON   potassium chloride 10 MEQ tablet Commonly known as:  K-DUR,KLOR-CON   sertraline 100 MG tablet Commonly known as:  ZOLOFT     TAKE these medications   acetaminophen 325 MG tablet Commonly known as:  TYLENOL Take 650 mg by mouth 2 (two) times daily.   morphine CONCENTRATE 10 MG/0.5ML Soln concentrated solution Take 0.25 mLs (5 mg total) by mouth every 6 (six) hours.       No Known Allergies  Consultations:  Palliative Care Medicine  Procedures/Studies: Dg Chest 2 View  Result Date: 08/14/2017 CLINICAL DATA:  Tachycardia and altered mental status. EXAM: CHEST  2 VIEW COMPARISON:  None. FINDINGS: Heart size is top-normal. Moderate aortic atherosclerosis at the arch without aneurysm. Lungs are clear with minimal scarring or atelectasis in the left lower lobe. Left shoulder arthroplasty without complications. Mild degenerate change along the dorsal spine. IMPRESSION: No active cardiopulmonary disease.  Aortic atherosclerosis. Electronically Signed   By: Tollie Eth M.D.   On: 08/14/2017 17:14   Ct Head Wo Contrast  Result Date: 08/14/2017 CLINICAL DATA:  Altered level of consciousness unexplained. History of right-sided paralysis with left-sided facial droop. Patient is nonverbal. EXAM: CT HEAD WITHOUT CONTRAST TECHNIQUE: Contiguous axial images were obtained from the base of the skull through the vertex without intravenous contrast. COMPARISON:  None. FINDINGS: Brain: Frontoparietal left-sided encephalomalacia with volume loss compatible with remote infarcts. Superficial and central atrophy is noted with chronic  appearing small vessel ischemic disease. No acute intracranial hemorrhage, midline shift or edema. No intra-axial mass nor extra-axial fluid collections. Vascular: Moderate atherosclerotic calcifications of the cavernous sinus carotids. No hyperdense vessels. Skull: Negative for fracture or focal lesions. Sinuses/Orbits: Mild sphenoid sinus mucosal thickening. Intact globes with unusual serpiginous hyperdensities in the left globe possibly representing a varix. Recommend correlation with patient's prior ophthalmologic procedures and exam. Other: None IMPRESSION: 1. Chronic left MCA and watershed distribution infarcts. No acute intracranial abnormality. Atrophy with chronic small vessel ischemia. 2. Unusual hyperdensities, slightly serpiginous appearance within the left globe as above. Recommend correlation with patient's prior ophthalmologic procedures and exam. Given maintained spherical appearance of the globe, pthisis bulbi is believed less likely. Varix or stigmata of prior ocular intervention are some considerations. Electronically Signed   By: Tollie Eth M.D.   On: 08/14/2017 17:03   US Renal  Result Date: 08/16/2017 CLINICAL DATA:  Acute kidney injury EXAM: RENAL / URINARY TRACT ULTRASOUND COMPLETE COMPARISON:  None. FINDINGS: Right Kidney: Length: 9.1 cm. Echogenicity within normal limits. No mass or hydronephrosis visualized. Left Kidney: Length: 9.8 cm. Echogenicity within normal limits. No mass or hydronephrosis visualized. Bladder: Slightly thickened wall likely due to under distension IMPRESSION: 1. Negative sonographic appearance of the kidneys 2. Slight bladder wall thickening likely due to under  distension Electronically Signed   By: Jasmine Pang M.D.   On: 08/16/2017 00:07    Subjective: Patient unable to provide subjective hx today and family at bedside spoke very little [place]. Talked with nurse and there have been no acute changes since yesterday. Social work [facility] so will D/C today.  Discharge Exam: Vitals:   08/21/17 1232 08/22/17 0605  BP: (!) 141/65 (!) 154/61  Pulse: 96 88  Resp:  18  Temp:  98.2 F (36.8 C)  SpO2: 100% 94%   Vitals:   08/21/17 0619 08/21/17 1232 08/21/17 2052 08/22/17 0605  BP:  (!) 141/65  (!) 154/61  Pulse:  96  88  Resp:    18  Temp:    98.2 F (36.8 C)  TempSrc:    Oral  SpO2:  100%  94%  Weight: 38.5 kg (84 lb 14 oz)     Height:   5' (1.524 m)    General: Pt is awake but not alert. Unable to obtain subjective Hx from patient as she is nonverbal and does not follow commands Cardiovascular: RRR, S1/S2 +, no rubs, no gallops Respiratory: Diminished bilaterally, no wheezing, no rhonchi Abdominal: Soft, NT, ND, bowel sounds + Extremities: no edema, no cyanosis  The results of significant diagnostics from this hospitalization (including imaging, microbiology, ancillary and laboratory) are listed below for reference.    Microbiology: Recent Results (from the past 240 hour(s))  Urine culture     Status: Abnormal   Collection Time: 08/14/17  3:56 PM  Result Value Ref Range Status   Specimen Description URINE, CATHETERIZED  Final   Special Requests Normal  Final   Culture >=100,000 COLONIES/mL ESCHERICHIA COLI (A)  Final   Report Status 08/16/2017 FINAL  Final   Organism ID, Bacteria ESCHERICHIA COLI (A)  Final      Susceptibility   Escherichia coli - MIC*    AMPICILLIN 4 SENSITIVE Sensitive     CEFAZOLIN <=4 SENSITIVE Sensitive     CEFTRIAXONE <=1 SENSITIVE Sensitive     CIPROFLOXACIN <=0.25 SENSITIVE Sensitive     GENTAMICIN <=1 SENSITIVE Sensitive     IMIPENEM <=0.25 SENSITIVE Sensitive     NITROFURANTOIN <=16 SENSITIVE Sensitive     TRIMETH/SULFA <=20 SENSITIVE Sensitive     AMPICILLIN/SULBACTAM <=2 SENSITIVE Sensitive     PIP/TAZO <=4 SENSITIVE Sensitive     Extended ESBL NEGATIVE Sensitive     * >=100,000 COLONIES/mL ESCHERICHIA COLI  Blood Culture (routine x 2)     Status: None    Collection Time: 08/14/17  4:25 PM  Result Value Ref Range Status   Specimen Description BLOOD RIGHT HAND  Final   Special Requests   Final    BOTTLES DRAWN AEROBIC AND ANAEROBIC Blood Culture adequate volume   Culture NO GROWTH 5 DAYS  Final   Report Status 08/19/2017 FINAL  Final  Blood Culture (routine x 2)     Status: None   Collection Time: 08/14/17  4:58 PM  Result Value Ref Range Status   Specimen Description BLOOD RIGHT FOREARM  Final   Special Requests   Final    BOTTLES DRAWN AEROBIC AND ANAEROBIC Blood Culture adequate volume   Culture NO GROWTH 5 DAYS  Final   Report Status 08/19/2017 FINAL  Final  MRSA PCR Screening     Status: None   Collection Time: 08/15/17  3:21 AM  Result Value Ref Range Status   MRSA by PCR NEGATIVE NEGATIVE Final  Comment:        The GeneXpert MRSA Assay (FDA approved for NASAL specimens only), is one component of a comprehensive MRSA colonization surveillance program. It is not intended to diagnose MRSA infection nor to guide or monitor treatment for MRSA infections.     Labs: BNP (last 3 results) No results for input(s): BNP in the last 8760 hours. Basic Metabolic Panel: Recent Labs  Lab 08/16/17 2244  08/17/17 1013 08/17/17 1631 08/18/17 0655 08/18/17 1603 08/19/17 0741  NA 156*   < > 151* 148* 143 142 146*  K 3.5   < > 3.5 3.1* 2.7* 4.4 5.2*  CL 127*   < > 124* 120* 115* 116* 124*  CO2 17*   < > 16* 17* 17* 15* 11*  GLUCOSE 124*   < > 229* 107* 230* 108* 134*  BUN 42*   < > 31* 24* 21* 18 13  CREATININE 0.94   < > 0.97 0.83 0.90 0.77 0.58  CALCIUM 7.9*   < > 7.7* 7.8* 7.6* 7.8* 7.7*  MG 2.4  --   --   --  1.9  --  1.9  PHOS 1.9*  --   --   --  2.0*  --  2.8   < > = values in this interval not displayed.   Liver Function Tests: No results for input(s): AST, ALT, ALKPHOS, BILITOT, PROT, ALBUMIN in the last 168 hours. No results for input(s): LIPASE, AMYLASE in the last 168 hours. No results for input(s): AMMONIA in  the last 168 hours. CBC: No results for input(s): WBC, NEUTROABS, HGB, HCT, MCV, PLT in the last 168 hours. Cardiac Enzymes: No results for input(s): CKTOTAL, CKMB, CKMBINDEX, TROPONINI in the last 168 hours. BNP: Invalid input(s): POCBNP CBG: Recent Labs  Lab 08/19/17 0038 08/19/17 0445 08/19/17 0748 08/19/17 1202 08/19/17 1636  GLUCAP 118* 146* 137* 180* 144*   D-Dimer No results for input(s): DDIMER in the last 72 hours. Hgb A1c No results for input(s): HGBA1C in the last 72 hours. Lipid Profile No results for input(s): CHOL, HDL, LDLCALC, TRIG, CHOLHDL, LDLDIRECT in the last 72 hours. Thyroid function studies No results for input(s): TSH, T4TOTAL, T3FREE, THYROIDAB in the last 72 hours.  Invalid input(s): FREET3 Anemia work up No results for input(s): VITAMINB12, FOLATE, FERRITIN, TIBC, IRON, RETICCTPCT in the last 72 hours. Urinalysis    Component Value Date/Time   COLORURINE AMBER (A) 08/14/2017 1727   APPEARANCEUR CLOUDY (A) 08/14/2017 1727   LABSPEC 1.020 08/14/2017 1727   PHURINE 5.0 08/14/2017 1727   GLUCOSEU NEGATIVE 08/14/2017 1727   HGBUR NEGATIVE 08/14/2017 1727   BILIRUBINUR NEGATIVE 08/14/2017 1727   KETONESUR NEGATIVE 08/14/2017 1727   PROTEINUR NEGATIVE 08/14/2017 1727   NITRITE NEGATIVE 08/14/2017 1727   LEUKOCYTESUR MODERATE (A) 08/14/2017 1727   Sepsis Labs Invalid input(s): PROCALCITONIN,  WBC,  LACTICIDVEN Microbiology Recent Results (from the past 240 hour(s))  Urine culture     Status: Abnormal   Collection Time: 08/14/17  3:56 PM  Result Value Ref Range Status   Specimen Description URINE, CATHETERIZED  Final   Special Requests Normal  Final   Culture >=100,000 COLONIES/mL ESCHERICHIA COLI (A)  Final   Report Status 08/16/2017 FINAL  Final   Organism ID, Bacteria ESCHERICHIA COLI (A)  Final      Susceptibility   Escherichia coli - MIC*    AMPICILLIN 4 SENSITIVE Sensitive     CEFAZOLIN <=4 SENSITIVE Sensitive     CEFTRIAXONE <=1  SENSITIVE  Sensitive     CIPROFLOXACIN <=0.25 SENSITIVE Sensitive     GENTAMICIN <=1 SENSITIVE Sensitive     IMIPENEM <=0.25 SENSITIVE Sensitive     NITROFURANTOIN <=16 SENSITIVE Sensitive     TRIMETH/SULFA <=20 SENSITIVE Sensitive     AMPICILLIN/SULBACTAM <=2 SENSITIVE Sensitive     PIP/TAZO <=4 SENSITIVE Sensitive     Extended ESBL NEGATIVE Sensitive     * >=100,000 COLONIES/mL ESCHERICHIA COLI  Blood Culture (routine x 2)     Status: None   Collection Time: 08/14/17  4:25 PM  Result Value Ref Range Status   Specimen Description BLOOD RIGHT HAND  Final   Special Requests   Final    BOTTLES DRAWN AEROBIC AND ANAEROBIC Blood Culture adequate volume   Culture NO GROWTH 5 DAYS  Final   Report Status 08/19/2017 FINAL  Final  Blood Culture (routine x 2)     Status: None   Collection Time: 08/14/17  4:58 PM  Result Value Ref Range Status   Specimen Description BLOOD RIGHT FOREARM  Final   Special Requests   Final    BOTTLES DRAWN AEROBIC AND ANAEROBIC Blood Culture adequate volume   Culture NO GROWTH 5 DAYS  Final   Report Status 08/19/2017 FINAL  Final  MRSA PCR Screening     Status: None   Collection Time: 08/15/17  3:21 AM  Result Value Ref Range Status   MRSA by PCR NEGATIVE NEGATIVE Final    Comment:        The GeneXpert MRSA Assay (FDA approved for NASAL specimens only), is one component of a comprehensive MRSA colonization surveillance program. It is not intended to diagnose MRSA infection nor to guide or monitor treatment for MRSA infections.    Time coordinating discharge: 25 minutes  SIGNED:  Merlene Laughter, DO [facility] 08/22/2017, 1:19 PM Pager [phone]  If 7PM-7AM, please contact night-coverage [email or web address] Password [email or web address]

## 2017-08-22 NOTE — Clinical Social Work Note (Addendum)
[facility] [place] might be able to consider taking patient but are also concerned they would have to transfer her to another facility. [facility] [place] declined a bed offer. Discussed with Lorinda CreedMary Larach, NP with palliative team. Discussed SNF with hospice. CSW called a few SNF's in [place] but hospice care would be an out-of-pocket cost. One facility said family would have to pay $230 per day. CSW and Corrie DandyMary also discussed SNF without hospice. She will discuss with patient's daughter today.  Charlynn CourtSarah Kyree Adriano, CSW [phone]  12:09 pm [facility] [place]. They have emailed CSW the paperwork that patient's daughter needs to sign. Patient's daughter is not at the hospital yet but they will call her to notify. CSW called [facility] to find out cost of [facility] the facility. Price is $1,218.51. Patient's daughter will call her brother to see if he can pay the amount.  Charlynn CourtSarah Quashon Jesus, CSW [phone]  1:10 pm Patient's daughter, [name], is not coming to the hospital today but her sister [name] will arrive in about an hour. [name] will call CSW when [name] arrives and she will help us go over paperwork for hospice facility. [name] states her brother has agreed to pay for the ambulance transport to the facility.   Charlynn CourtSarah Dymphna Wadley, CSW [phone]  3:22 pm Paperwork completed for consent to treat patient and faxed to hospice facility. CSW sent email to contact to notify and see when transport can be arranged.  Charlynn CourtSarah Logann Whitebread, CSW [phone]

## 2018-09-21 IMAGING — CT CT HEAD W/O CM
4 series · 14 of 47 positions shown, 16 images · non-contrast
Comparison: None.

CLINICAL DATA: Altered level of consciousness unexplained. History
of right-sided paralysis with left-sided facial droop. Patient is
nonverbal.

EXAM:
CT HEAD WITHOUT CONTRAST
TECHNIQUE: Contiguous axial images were obtained from the base of the skull
through the vertex without intravenous contrast.

[Series 3: head wo · axial · 0.32mm/px · z∈[+224,+339]mm · 7 of 31 slices shown, 9 images]
[im 4/31  brain]
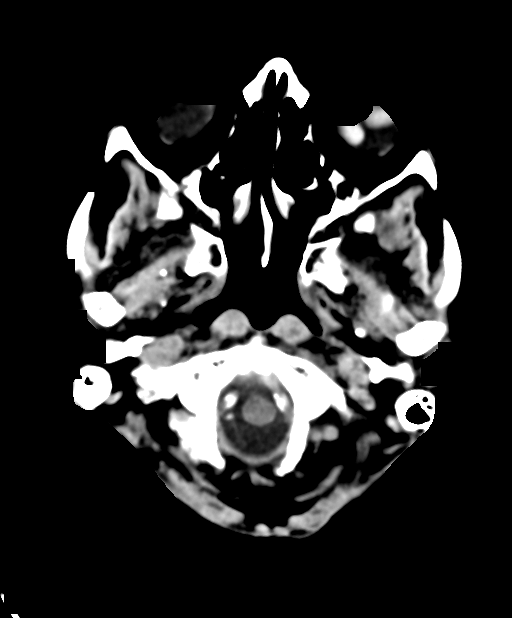
[im 4/31  bone]
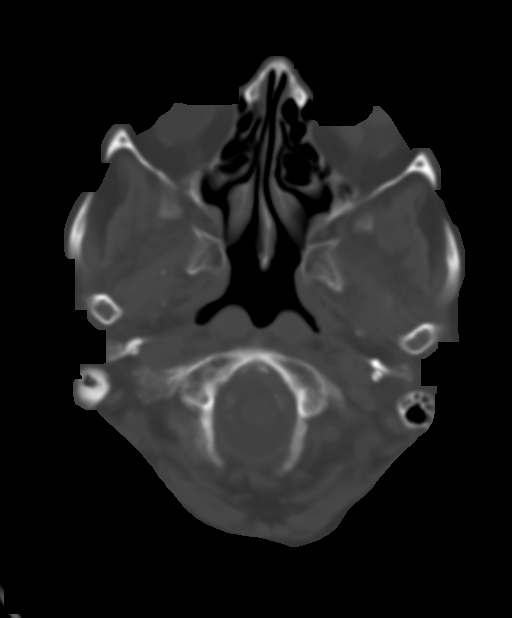
[im 8/31  brain]
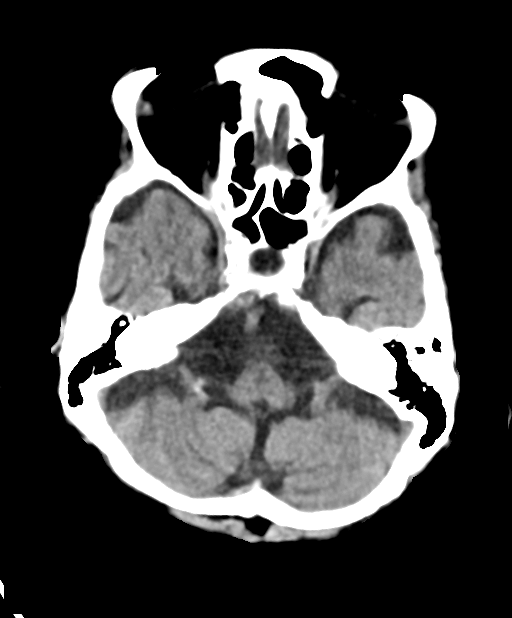
[im 12/31  brain]
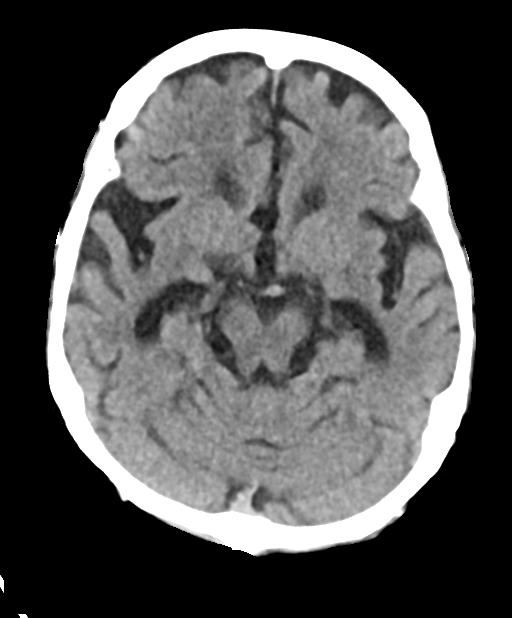
[im 16/31  brain]
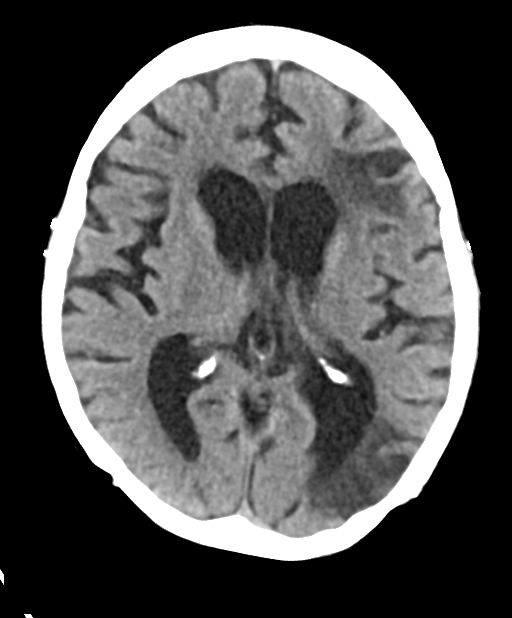
[im 19/31  brain]
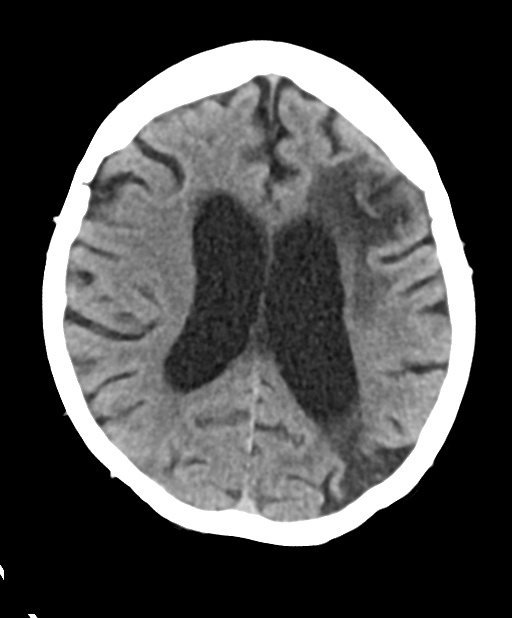
[im 19/31  bone]
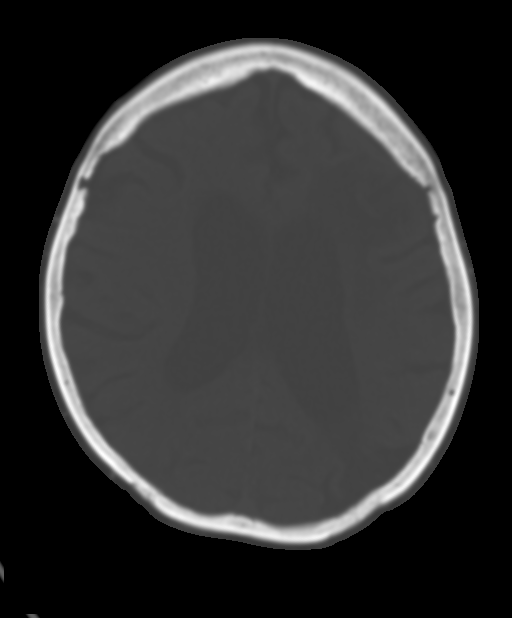
[im 23/31  brain]
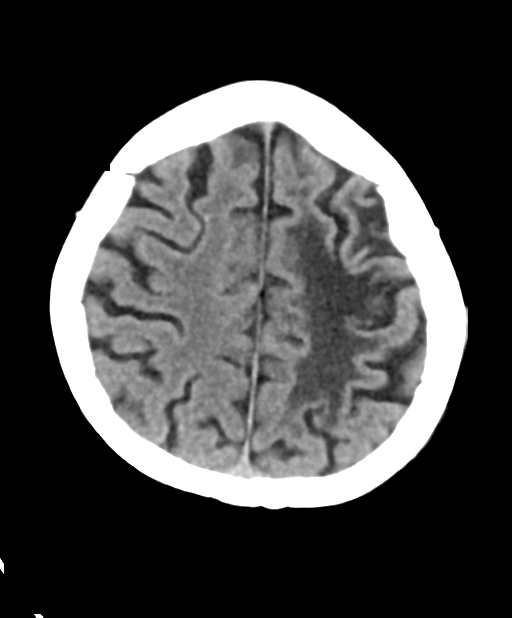
[im 27/31  brain]
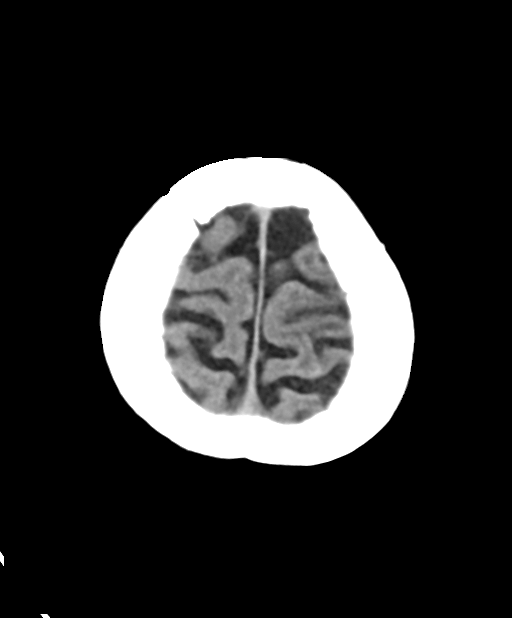

[Series 4: head bone · axial · 0.32mm/px · 1 of 77 slices shown]
[im 8/77  bone]
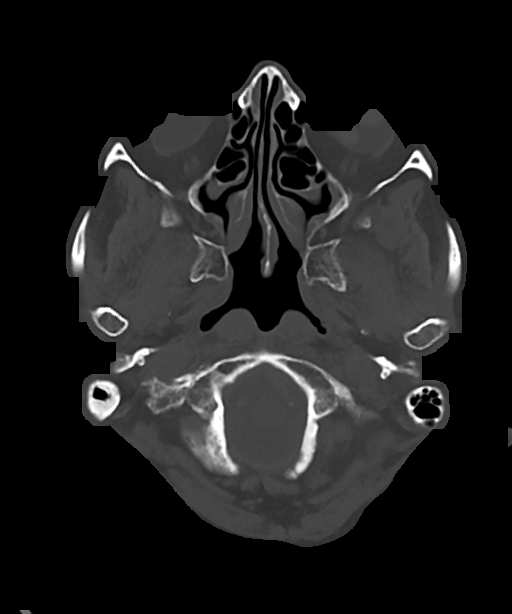

[Series 6: cor soft · coronal · 0.30mm/px · 3 of 61 slices shown]
[im 21/61  brain]
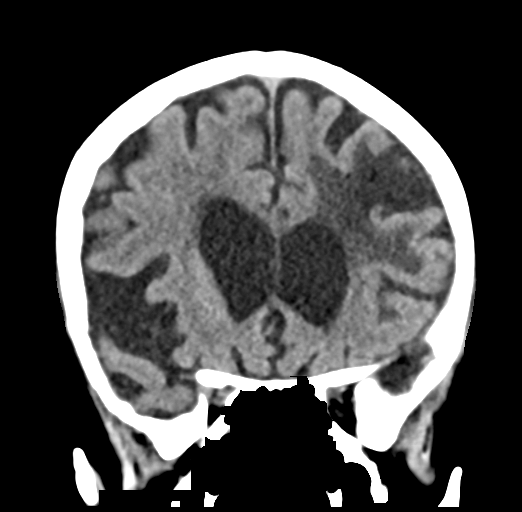
[im 27/61  brain]
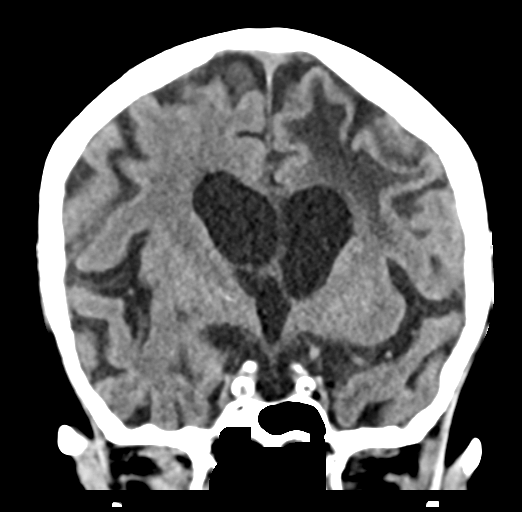
[im 34/61  brain]
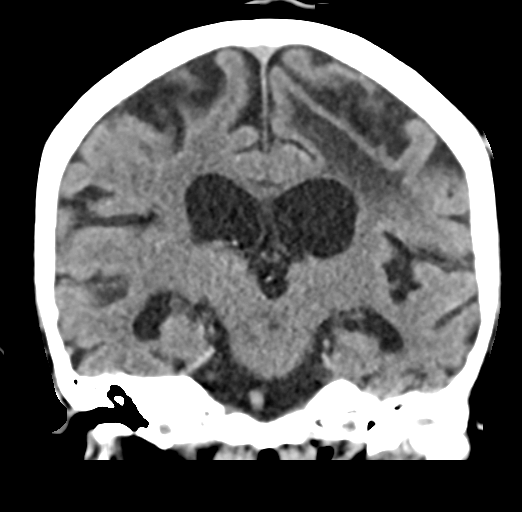

[Series 7: sag soft · sagittal · 0.30mm/px · 3 of 51 slices shown]
[im 17/51  brain]
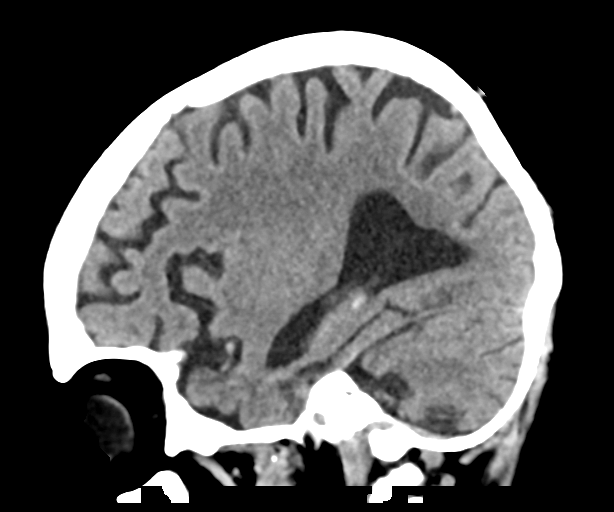
[im 26/51  brain]
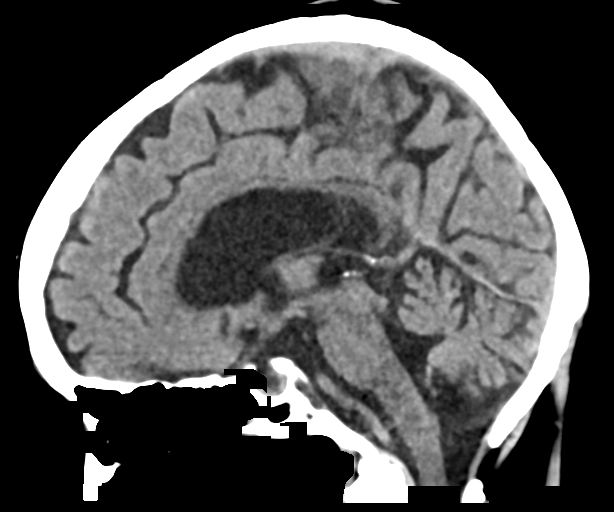
[im 34/51  brain]
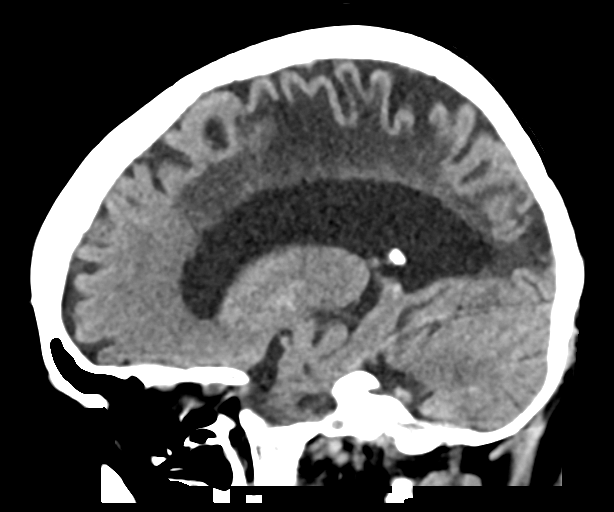

[14 of 47 positions shown; findings below may reference images not displayed]

FINDINGS: Brain: Frontoparietal left-sided encephalomalacia with volume loss
compatible with remote infarcts. Superficial and central atrophy is
noted with chronic appearing small vessel ischemic disease. No acute
intracranial hemorrhage, midline shift or edema. No intra-axial mass
nor extra-axial fluid collections.

Vascular: Moderate atherosclerotic calcifications of the cavernous
sinus carotids. No hyperdense vessels.

Skull: Negative for fracture or focal lesions.

Sinuses/Orbits: Mild sphenoid sinus mucosal thickening. Intact
globes with unusual serpiginous hyperdensities in the left globe
possibly representing a varix. Recommend correlation with patient's
prior ophthalmologic procedures and exam.

Other: None
IMPRESSION: 1. Chronic left MCA and watershed distribution infarcts. No acute
intracranial abnormality. Atrophy with chronic small vessel
ischemia.
2. Unusual hyperdensities, slightly serpiginous appearance within
the left globe as above. Recommend correlation with patient's prior
ophthalmologic procedures and exam. Given maintained spherical
appearance of the globe, pthisis bulbi is believed less likely.
Varix or stigmata of prior ocular intervention are some
considerations.

## 2019-08-25 IMAGING — US US RENAL
1 series · 14 of 25 positions shown · non-contrast
Comparison: None.

CLINICAL DATA: Acute kidney injury

EXAM:
RENAL / URINARY TRACT ULTRASOUND COMPLETE

[Series 1: us renal · 0.22mm/px · 14 of 36 slices shown]
[im 1/36]
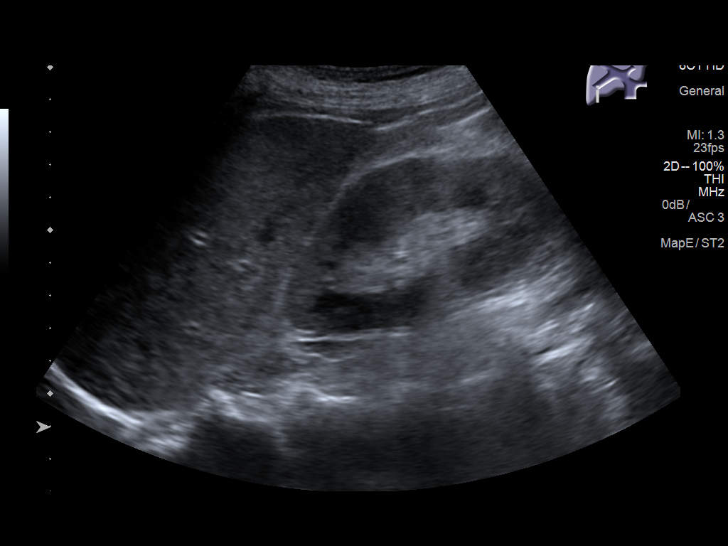
[im 3/36]
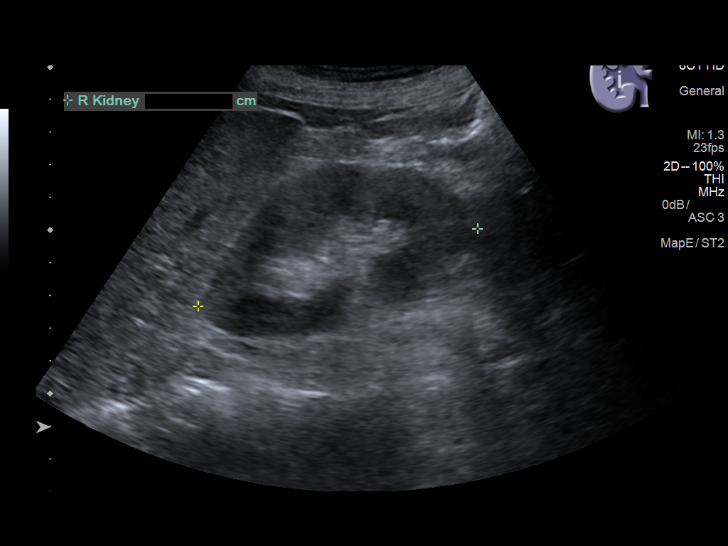
[im 6/36]
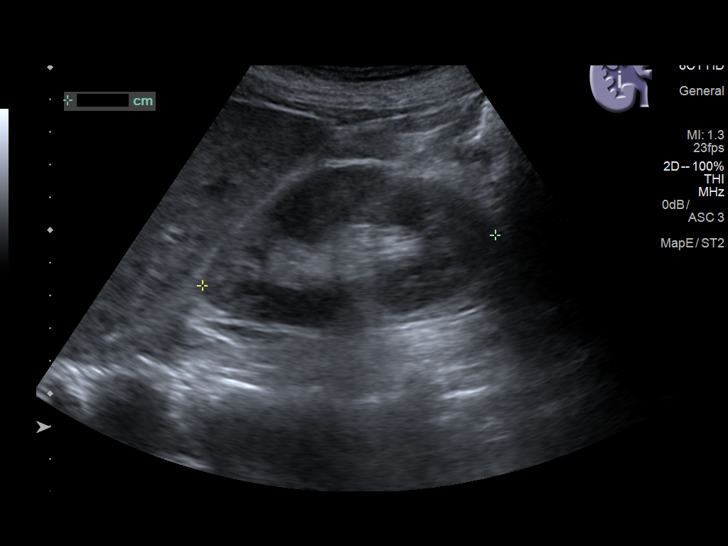
[im 9/36]
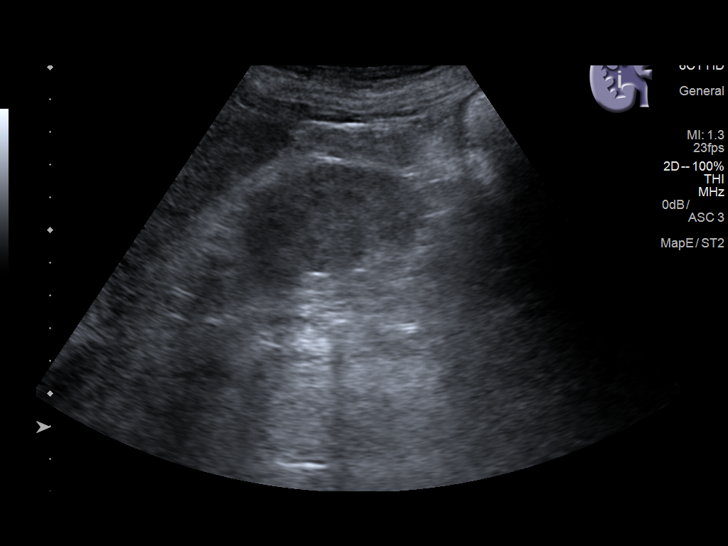
[im 12/36]
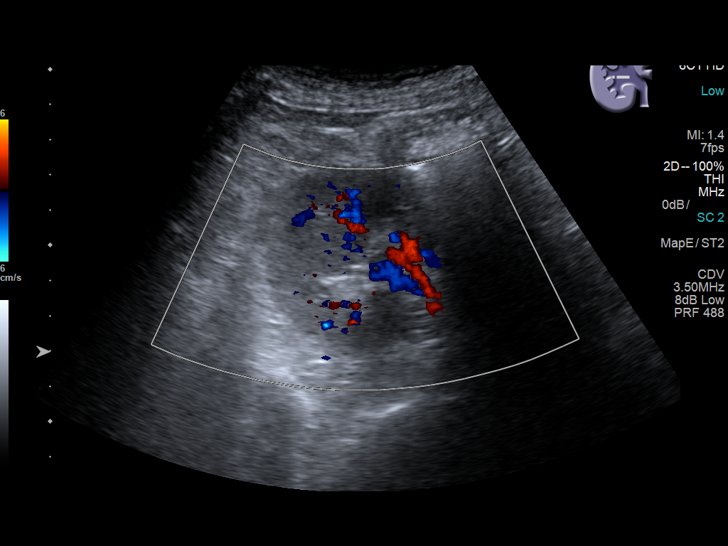
[im 14/36]
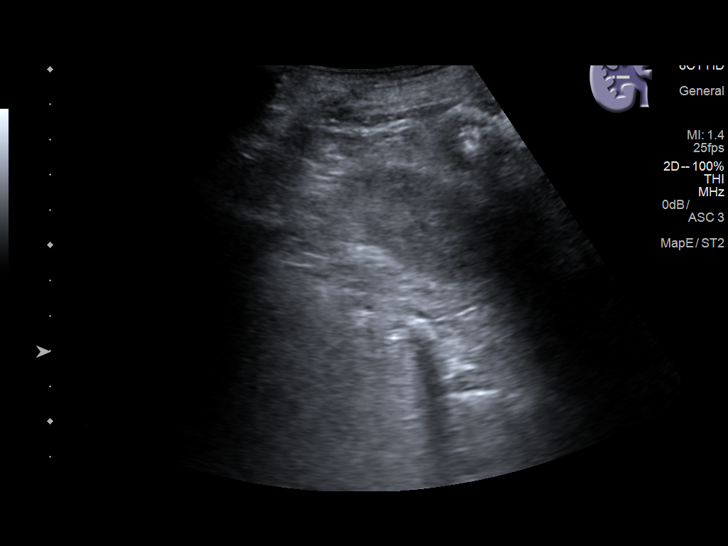
[im 17/36]
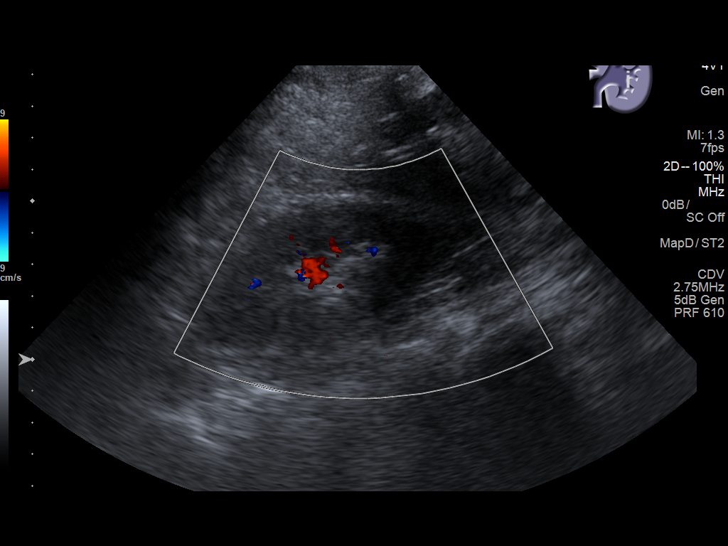
[im 19/36]
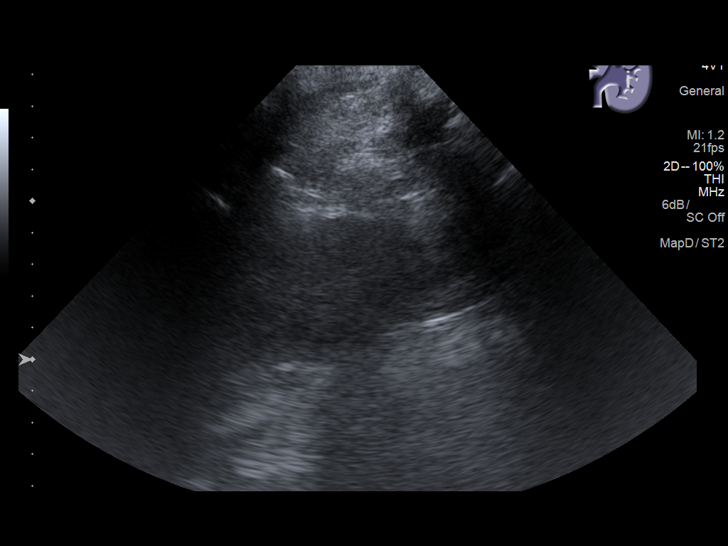
[im 22/36]
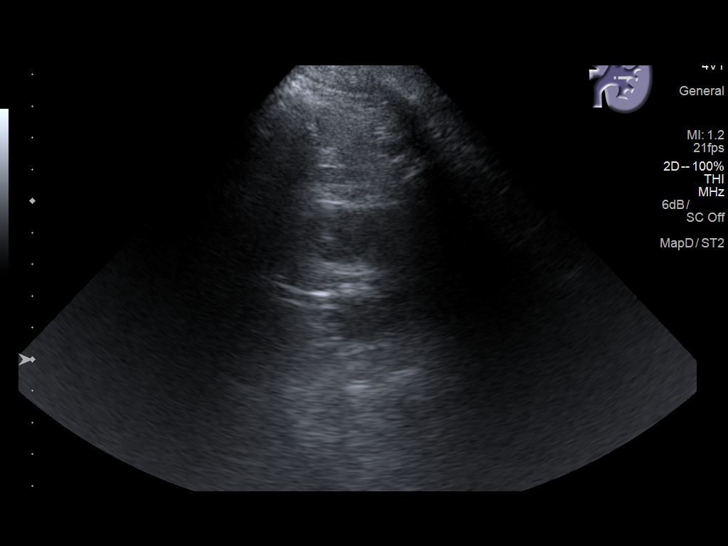
[im 24/36]
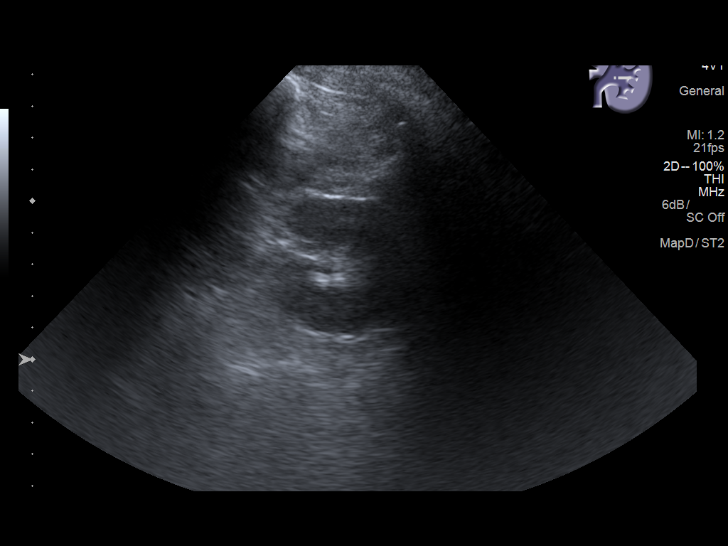
[im 27/36]
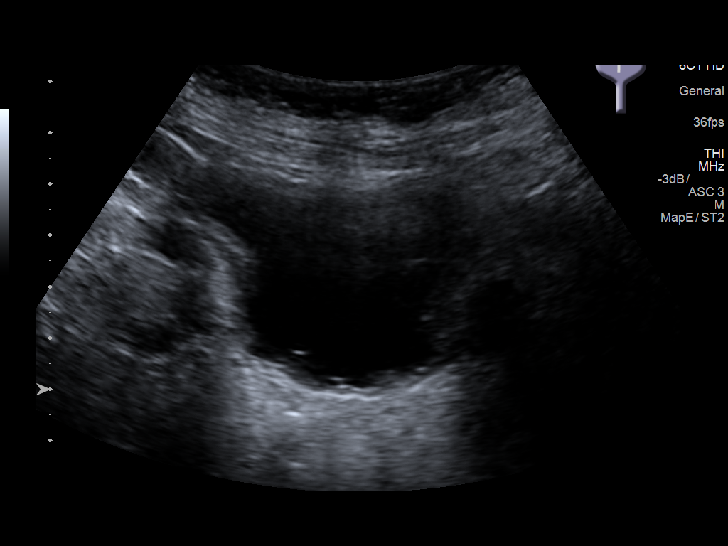
[im 30/36]
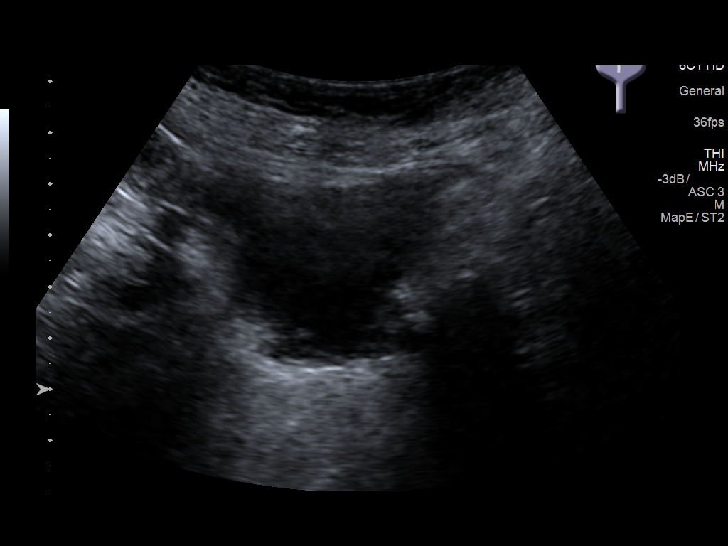
[im 33/36]
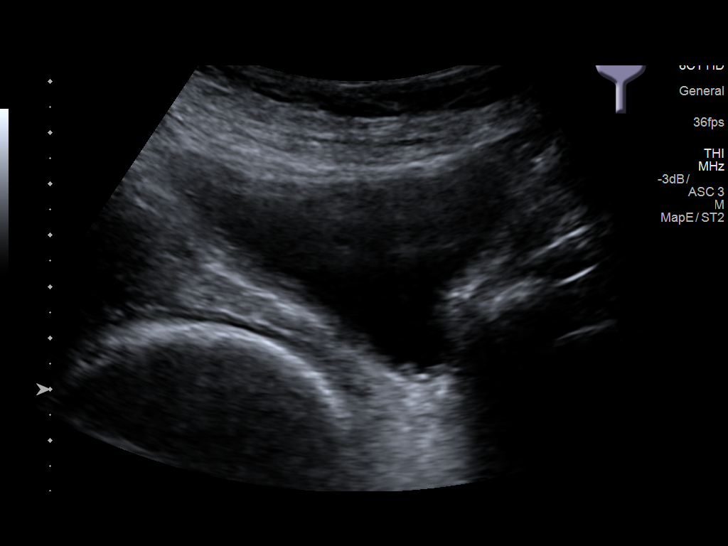
[im 36/36]
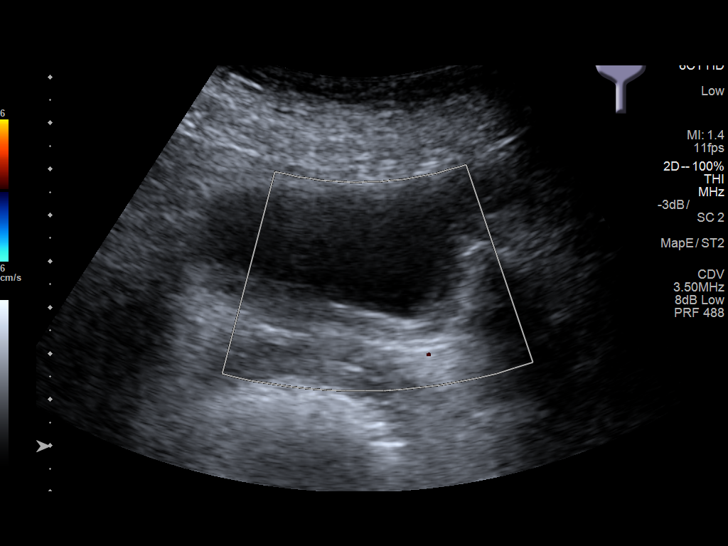

[14 of 25 positions shown; findings below may reference images not displayed]

FINDINGS: Right Kidney:

Length: 9.1 cm. Echogenicity within normal limits. No mass or
hydronephrosis visualized.

Left Kidney:

Length: 9.8 cm. Echogenicity within normal limits. No mass or
hydronephrosis visualized.

Bladder:

Slightly thickened wall likely due to under distension
IMPRESSION: 1. Negative sonographic appearance of the kidneys
2. Slight bladder wall thickening likely due to under distension
